# Patient Record
Sex: Female | Born: 1990 | Race: White | Hispanic: No | Marital: Single | Smoking: Never smoker
Health system: Southern US, Community
[De-identification: ages and names within clinical notes are randomized; demographics above are authoritative.]

## PROBLEM LIST (undated history)

## (undated) DIAGNOSIS — M549 Dorsalgia, unspecified: Secondary | ICD-10-CM

## (undated) DIAGNOSIS — L409 Psoriasis, unspecified: Secondary | ICD-10-CM

## (undated) DIAGNOSIS — L709 Acne, unspecified: Secondary | ICD-10-CM

## (undated) DIAGNOSIS — F419 Anxiety disorder, unspecified: Secondary | ICD-10-CM

## (undated) HISTORY — DX: Acne, unspecified: L70.9

## (undated) HISTORY — PX: NO PAST SURGERIES: SHX2092

## (undated) HISTORY — DX: Psoriasis, unspecified: L40.9

---

## 2016-06-14 DIAGNOSIS — L409 Psoriasis, unspecified: Secondary | ICD-10-CM | POA: Insufficient documentation

## 2016-06-14 DIAGNOSIS — F419 Anxiety disorder, unspecified: Secondary | ICD-10-CM | POA: Insufficient documentation

## 2016-06-14 DIAGNOSIS — Z227 Latent tuberculosis: Secondary | ICD-10-CM | POA: Insufficient documentation

## 2016-06-14 DIAGNOSIS — L709 Acne, unspecified: Secondary | ICD-10-CM | POA: Insufficient documentation

## 2016-06-18 ENCOUNTER — Ambulatory Visit
Admission: RE | Admit: 2016-06-18 | Discharge: 2016-06-18 | Disposition: A | Discharge status: Home / Self Care | Payer: BC Managed Care – PPO | Source: Ambulatory Visit | Attending: Internal Medicine | Admitting: Internal Medicine

## 2016-06-18 ENCOUNTER — Encounter: Payer: Self-pay | Admitting: Internal Medicine

## 2016-06-18 ENCOUNTER — Other Ambulatory Visit: Payer: Self-pay | Admitting: *Deleted

## 2016-06-18 ENCOUNTER — Ambulatory Visit (INDEPENDENT_AMBULATORY_CARE_PROVIDER_SITE_OTHER): Payer: BC Managed Care – PPO | Admitting: Internal Medicine

## 2016-06-18 DIAGNOSIS — L409 Psoriasis, unspecified: Secondary | ICD-10-CM

## 2016-06-18 DIAGNOSIS — F419 Anxiety disorder, unspecified: Secondary | ICD-10-CM

## 2016-06-18 DIAGNOSIS — R7611 Nonspecific reaction to tuberculin skin test without active tuberculosis: Secondary | ICD-10-CM

## 2016-06-18 DIAGNOSIS — Z227 Latent tuberculosis: Secondary | ICD-10-CM

## 2016-06-18 DIAGNOSIS — L709 Acne, unspecified: Secondary | ICD-10-CM

## 2016-06-18 MED ORDER — RIFAPENTINE 150 MG PO TABS: 900.0000 mg | ORAL_TABLET | ORAL | 2 refills | Status: DC

## 2016-06-18 MED ORDER — ISONIAZID 300 MG PO TABS: 900.0000 mg | ORAL_TABLET | ORAL | 2 refills | Status: DC

## 2016-06-18 NOTE — Assessment & Plan Note (Signed)
She has latent tuberculosis. I will get a baseline chest x-ray and start her on the new 12 week regimen consisting of weekly isoniazid in rifapentine. Her recent liver enzymes were normal. I reviewed potential side effects and drug interactions. Rifapentine can interact with alprazolam leading to reduced, affective blood levels of alprazolam. She only takes it as needed and fairly infrequently. I doubt that this will be a significant problem.

## 2016-06-18 NOTE — Progress Notes (Signed)
RN called patient to see if she was on her way to her 9am appointment with Dr Orvan Falconerampbell, left message on voicemail. Andree CossHowell, Harli Engelken M, RN

## 2016-06-18 NOTE — Progress Notes (Signed)
[facility]  Reason for Consult: Latent tuberculosis Referring Physician: Dr. Sigmund Hazel  Patient Active Problem List   Diagnosis Date Noted  . Latent tuberculosis by blood test 06/14/2016    Priority: High  . 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 Acne 06/14/2016  . Anxiety 06/14/2016    Patient's Medications  New Prescriptions   ISONIAZID (NYDRAZID) 300 MG TABLET    Take 3 tablets (900 mg total) by mouth once a week.   RIFAPENTINE 150 MG TABS    Take 6 tablets (900 mg total) by mouth once a week.  Previous Medications   ALPRAZOLAM (XANAX) 0.25 MG TABLET       FERROUS SULFATE (IRON SUPPLEMENT) 325 (65 FE) MG TABLET    Take 325 mg by mouth daily with breakfast.  Modified Medications   No medications on file  Discontinued Medications   No medications on file    Recommendations: 1. Chest x-ray today 2. Start isoniazid 900 mg weekly and rifapentine 900 mg weekly for 12 weeks 3. Follow-up in 6 weeks   Assessment: She has latent tuberculosis. I will get a baseline chest x-ray and start her on the new 12 week regimen consisting of weekly isoniazid in rifapentine. Her recent liver enzymes were normal. I reviewed potential side effects and drug interactions. Rifapentine can interact with alprazolam leading to reduced, affective blood levels of alprazolam. She only takes it as needed and fairly infrequently. I doubt that this will be a significant problem.   HPI: Cindy Vasquez is a 26 y.o. female who is referred to me because she recently tested positive on a QuantiFERON gold TB assay. She was being screened for latent tuberculosis because she has nail psoriasis and may start immunosuppressive therapies. 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 All previous test had been negative. 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 She has no history of pneumonia.  She has had no fever, chills, sweats, cough, shortness of breath, change in appetite or weight. She takes iron supplements and alprazolam as needed. She is not on oral contraceptives and is not currently pregnant.  Review of Systems: Review of Systems  Constitutional: Negative for chills, diaphoresis, fever, malaise/fatigue and weight loss.  HENT: Negative for sore throat.   Respiratory: Negative for cough, sputum production and shortness of breath.   Cardiovascular: Negative for chest pain.  Gastrointestinal: Negative for abdominal pain, diarrhea, heartburn, nausea and vomiting.  Genitourinary: Negative for dysuria and frequency.  Musculoskeletal: Negative for joint pain and myalgias.  Skin: Negative for rash.  Neurological: Negative for dizziness and headaches.  Psychiatric/Behavioral: Negative for depression and substance abuse. The patient is nervous/anxious.       No past medical history on file.  Social History  Substance Use Topics  . Smoking status: Not on file  . Smokeless tobacco: Not on file  . Alcohol use Not on file    No family history on file. Allergies not on file  OBJECTIVE: Vitals:   06/18/16 0923  BP: 137/84  Pulse: 91  Temp: 99.3 F (37.4 C)  TempSrc: Oral  Weight: 141 lb (64 kg)  Height: 5\' 7"  (1.702 m)   Body mass index is 22.08 kg/m.   Physical Exam  Constitutional: She is oriented to person, place, and time.  She is smiling and in good spirits.  HENT:  Mouth/Throat: No oropharyngeal exudate.  Cardiovascular: Normal rate and regular rhythm.   No murmur heard. Pulmonary/Chest: Effort normal and breath sounds normal. She has no wheezes. She has no rales.  Abdominal: Soft. There is no tenderness.  Neurological: She is alert and oriented to person, place, and time.  Skin: No rash noted.  Psychiatric: Mood and affect normal.    Microbiology: No results found for this or any previous visit (from the past 240 hour(s)).  Cliffton AstersJohn Ralene Gasparyan,  MD [facility] [phone] pager   [phone] cell 06/18/2016, 9:50 AM

## 2016-08-06 ENCOUNTER — Ambulatory Visit: Payer: BC Managed Care – PPO | Admitting: Internal Medicine

## 2016-09-10 ENCOUNTER — Encounter: Payer: Self-pay | Admitting: Internal Medicine

## 2016-09-10 ENCOUNTER — Ambulatory Visit (INDEPENDENT_AMBULATORY_CARE_PROVIDER_SITE_OTHER): Payer: BC Managed Care – PPO | Admitting: Internal Medicine

## 2016-09-10 DIAGNOSIS — R7611 Nonspecific reaction to tuberculin skin test without active tuberculosis: Secondary | ICD-10-CM

## 2016-09-10 DIAGNOSIS — Z227 Latent tuberculosis: Secondary | ICD-10-CM

## 2016-09-10 NOTE — Progress Notes (Signed)
[facility]  Patient Active Problem List   Diagnosis Date Noted  . Latent tuberculosis by blood test 06/14/2016    Priority: High  . 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 Acne 06/14/2016  . Anxiety 06/14/2016    Patient's Medications  New Prescriptions   No medications on file  Previous Medications   ALPRAZOLAM (XANAX) 0.25 MG TABLET       CEPHALEXIN (KEFLEX) 500 MG CAPSULE       FERROUS SULFATE (IRON SUPPLEMENT) 325 (65 FE) MG TABLET    Take 325 mg by mouth daily with breakfast.   ISONIAZID (NYDRAZID) 300 MG TABLET    Take 3 tablets (900 mg total) by mouth once a week.   RIFAPENTINE 150 MG TABS    Take 6 tablets (900 mg total) by mouth once a week.  Modified Medications   No medications on file  Discontinued Medications   No medications on file    Subjective: Ms. Dampier is in for her routine follow-up visit. She started on INH and rifapentine on 08/03/2016. She is now completed 6 of her weekly doses. She has a little bit of stomach upset on the day that she takes her medication but otherwise is tolerating it well. She also recently developed a vaginal discharge and was told that she had staph infection. She started taking cephalexin yesterday. She is worried about how these medications may affect her liver.  Review of Systems: Review of Systems  Constitutional: Negative for chills, diaphoresis, fever and weight loss.  Respiratory: Negative for cough, sputum production and shortness of breath.   Gastrointestinal: Negative for abdominal pain, diarrhea, heartburn, nausea and vomiting.       As noted in history of present illness.    Past Medical History:  Diagnosis Date  . Acne   . Psoriasis of nail     Social History  Substance Use Topics  . Smoking status: Never Smoker  . Smokeless tobacco: Never Used  . Alcohol use No     Comment: occassional    Family History  Problem Relation Age of Onset  . Rheum arthritis Mother   .  Hypertension Mother   . Asthma Sister   . Rheum arthritis Sister   . Anxiety disorder Sister   . Psoriasis Sister     No Known Allergies  Objective: Vitals:   09/10/16 0903  BP: 105/72  Pulse: 90  Temp: 98.8 F (37.1 C)  TempSrc: Oral  SpO2: 100%  Weight: 139 lb 12.8 oz (63.4 kg)   Body mass index is 21.9 kg/m.  Physical Exam  Constitutional: She is oriented to person, place, and time.  She is alert and in good spirits.  Cardiovascular: Normal rate and regular rhythm.   No murmur heard. Pulmonary/Chest: Effort normal and breath sounds normal. She has no wheezes. She has no rales.  Abdominal: Soft. There is no tenderness.  Neurological: She is oriented to person, place, and time.  Skin: No rash noted.  Psychiatric: Mood and affect normal.    Lab Results    Problem List Items Addressed This Visit      High   Latent tuberculosis by blood test    She is doing reasonably well on therapy for latent tuberculosis. I will have her check liver enzymes today and see her back after completion of therapy in 6 more weeks.          Cliffton Asters, MD [facility]  D[facility] [phone] pager   [phone] cell 09/10/2016, 9:18 AM

## 2016-09-10 NOTE — Assessment & Plan Note (Signed)
She is doing reasonably well on therapy for latent tuberculosis. I will have her check liver enzymes today and see her back after completion of therapy in 6 more weeks.

## 2016-09-11 LAB — COMPREHENSIVE METABOLIC PANEL
ALT: 11 U/L (ref 6–29)
AST: 17 U/L (ref 10–30)
Albumin: 4.8 g/dL (ref 3.6–5.1)
Alkaline Phosphatase: 74 U/L (ref 33–115)
BUN: 10 mg/dL (ref 7–25)
CHLORIDE: 105 mmol/L (ref 98–110)
CO2: 20 mmol/L (ref 20–32)
Calcium: 9.8 mg/dL (ref 8.6–10.2)
Creat: 0.74 mg/dL (ref 0.50–1.10)
GLUCOSE: 96 mg/dL (ref 65–99)
POTASSIUM: 4.4 mmol/L (ref 3.5–5.3)
Sodium: 138 mmol/L (ref 135–146)
Total Bilirubin: 1.1 mg/dL (ref 0.2–1.2)
Total Protein: 7.7 g/dL (ref 6.1–8.1)

## 2016-11-05 ENCOUNTER — Ambulatory Visit (INDEPENDENT_AMBULATORY_CARE_PROVIDER_SITE_OTHER): Payer: BC Managed Care – PPO | Admitting: Internal Medicine

## 2016-11-05 ENCOUNTER — Encounter: Payer: Self-pay | Admitting: Internal Medicine

## 2016-11-05 DIAGNOSIS — R7611 Nonspecific reaction to tuberculin skin test without active tuberculosis: Secondary | ICD-10-CM

## 2016-11-05 DIAGNOSIS — Z227 Latent tuberculosis: Secondary | ICD-10-CM

## 2016-11-05 NOTE — Assessment & Plan Note (Signed)
She has completed therapy for latent tuberculosis. I told her that this should decrease her lifetime risk of progressing to active tuberculosis by greater than 95%. I told her that she should still inform her physicians that she has been treated for latent tuberculosis in case she ever develops clinical evidence of possible active tuberculosis. She does not need repeat QuantiFERON gold or skin testing as these should always remain positive. If she needs therapy for her nail psoriasis she could start on that at any time from my standpoint. She can follow-up here as needed

## 2016-11-05 NOTE — Addendum Note (Signed)
Addended by: Andree Coss on: 11/05/2016 02:52 PM   Modules accepted: Orders

## 2016-11-05 NOTE — Progress Notes (Signed)
[facility]  Patient Active Problem List   Diagnosis Date Noted  . Latent tuberculosis by blood test 06/14/2016    Priority: High  . 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 Acne 06/14/2016  . Anxiety 06/14/2016    Patient's Medications  New Prescriptions   No medications on file  Previous Medications   ALPRAZOLAM (XANAX) 0.25 MG TABLET       CEPHALEXIN (KEFLEX) 500 MG CAPSULE       FERROUS SULFATE (IRON SUPPLEMENT) 325 (65 FE) MG TABLET    Take 325 mg by mouth daily with breakfast.   ISONIAZID (NYDRAZID) 300 MG TABLET    Take 3 tablets (900 mg total) by mouth once a week.   RIFAPENTINE 150 MG TABS    Take 6 tablets (900 mg total) by mouth once a week.  Modified Medications   No medications on file  Discontinued Medications   No medications on file    Subjective: Cindy Vasquez is here for her routine follow-up visit. She completed her 12 week course of isoniazid and rifampin 10 for latent tuberculosis in late September. She had some problems with vaginal discharge while on treatment but otherwise tolerated it well.  Review of Systems: Review of Systems  Constitutional: Negative for chills, diaphoresis, fever and weight loss.  Respiratory: Negative for cough, sputum production and shortness of breath.   Cardiovascular: Negative for chest pain.  Gastrointestinal: Negative for abdominal pain, diarrhea, nausea and vomiting.  Skin: Negative for rash.    Past Medical History:  Diagnosis Date  . Acne   . Psoriasis of nail     Social History  Substance Use Topics  . Smoking status: Never Smoker  . Smokeless tobacco: Never Used  . Alcohol use No     Comment: occassional    Family History  Problem Relation Age of Onset  . Rheum arthritis Mother   . Hypertension Mother   . Asthma Sister   . Rheum arthritis Sister   . Anxiety disorder Sister   . Psoriasis Sister     No Known Allergies  Objective: Vitals:   11/05/16 1422  BP: 124/72    Pulse: (!) 103  Temp: (!) 103 F (39.4 C)  TempSrc: Oral  Weight: 137 lb (62.1 kg)  Height:  (1.702 m)   Body mass index is 21.46 kg/m.  Physical Exam  Constitutional: She is oriented to person, place, and time.  She is smiling and in good spirits.  HENT:  Mouth/Throat: No oropharyngeal exudate.  Cardiovascular: Normal rate and regular rhythm.   No murmur heard. Pulmonary/Chest: Effort normal and breath sounds normal.  Neurological: She is alert and oriented to person, place, and time.  Skin: No rash noted.  Psychiatric: Mood and affect normal.    Lab Results    Problem List Items Addressed This Visit      High   Latent tuberculosis by blood test    She has completed therapy for latent tuberculosis. I told her that this should decrease her lifetime risk of progressing to active tuberculosis by greater than 95%. I told her that she should still inform her physicians that she has been treated for latent tuberculosis in case she ever develops clinical evidence of possible active tuberculosis. She does not need repeat QuantiFERON gold or skin testing as these should always remain positive. If she needs therapy for her nail psoriasis she could start on that at any time from my standpoint.  She can follow-up here as needed          Cliffton Asters, MD [facility] for Infectious Disease [facility] [phone] pager   [phone] cell 11/05/2016, 2:38 PM

## 2019-04-07 ENCOUNTER — Encounter: Payer: Self-pay | Admitting: Emergency Medicine

## 2019-04-07 ENCOUNTER — Emergency Department (INDEPENDENT_AMBULATORY_CARE_PROVIDER_SITE_OTHER): Payer: BC Managed Care – PPO

## 2019-04-07 ENCOUNTER — Emergency Department
Admission: EM | Admit: 2019-04-07 | Discharge: 2019-04-07 | Disposition: A | Discharge status: Home / Self Care | Payer: BC Managed Care – PPO | Source: Home / Self Care | Attending: Family Medicine | Admitting: Family Medicine

## 2019-04-07 ENCOUNTER — Other Ambulatory Visit: Payer: Self-pay

## 2019-04-07 DIAGNOSIS — M48061 Spinal stenosis, lumbar region without neurogenic claudication: Secondary | ICD-10-CM

## 2019-04-07 DIAGNOSIS — G8929 Other chronic pain: Secondary | ICD-10-CM | POA: Diagnosis not present

## 2019-04-07 DIAGNOSIS — M533 Sacrococcygeal disorders, not elsewhere classified: Secondary | ICD-10-CM

## 2019-04-07 DIAGNOSIS — M2170 Unequal limb length (acquired), unspecified site: Secondary | ICD-10-CM

## 2019-04-07 HISTORY — DX: Dorsalgia, unspecified: M54.9

## 2019-04-07 HISTORY — DX: Anxiety disorder, unspecified: F41.9

## 2019-04-07 MED ORDER — PREDNISONE 20 MG PO TABS: ORAL_TABLET | ORAL | 0 refills | Status: DC

## 2019-04-07 MED ORDER — CYCLOBENZAPRINE HCL 10 MG PO TABS: 10.0000 mg | ORAL_TABLET | Freq: Every day | ORAL | 1 refills | Status: DC

## 2019-04-07 NOTE — ED Provider Notes (Signed)
[facility]    CSN: [id number] Arrival date & time: 04/07/19  1652      History   Chief Complaint Chief Complaint  Patient presents with  . Back Pain    HPI ISMA TIETJE Cindy a 29 y.o. Vasquez.   While doing a short sprint four days ago patient experienced sudden sharp right lower back pain that has persisted, and occasionally radiates to her right buttock and lateral leg.  Rarely she feels pain radiating to her right foot.  She denies bowel or bladder dysfunction, and no saddle numbness.  Initially she was unable to walk without assistance.  She can now walk slowly, but still has pain with any movement. She has a history of recurring right lower back pain, having visited a chiropractor and back specialist about a month ago.  Her present pain Cindy the worst that she has had.  The history Cindy provided by the patient.  Back Pain Location:  Lumbar spine, sacro-iliac joint and gluteal region Quality:  Shooting and stabbing Radiates to:  R posterior upper leg Pain severity:  Severe Pain Cindy:  Same all the time Onset quality:  Sudden Duration:  4 days Timing:  Constant Progression:  Improving Chronicity:  Recurrent Context: recent injury   Relieved by:  Nothing Worsened by:  Ambulation, bending, movement and standing Ineffective treatments:  Cold packs and ibuprofen Associated symptoms: no abdominal pain, no abdominal swelling, no bladder incontinence, no bowel incontinence, no chest pain, no dysuria, no fever, no leg pain, no numbness, no paresthesias, no pelvic pain, no perianal numbness, no tingling, no weakness and no weight loss     Past Medical History:  Diagnosis Date  . Acne   . Anxiety   . Back pain   . Psoriasis of nail     Patient Active Problem List   Diagnosis Date Noted  . Latent tuberculosis by blood test 06/14/2016  . 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 Acne 06/14/2016  . Anxiety 06/14/2016    History reviewed. No pertinent surgical history.  OB  History   No obstetric history on file.      Home Medications    Prior to Admission medications   Medication Sig Start Date End Date Taking? Authorizing Provider  ALPRAZolam Prudy Feeler) 0.25 MG tablet  05/14/16   [provider]  cyclobenzaprine (FLEXERIL) 10 MG tablet Take 1 tablet (10 mg total) by mouth at bedtime. 04/07/19   Lattie Haw, MD  predniSONE (DELTASONE) 20 MG tablet Take one tab by mouth twice daily for 4 days, then one daily. Take with food. 04/07/19   Lattie Haw, MD    Family History Family History  Problem Relation Age of Onset  . Rheum arthritis Mother   . Hypertension Mother   . Asthma Sister   . Rheum arthritis Sister   . Anxiety disorder Sister   . Psoriasis Sister     Social History Social History   Tobacco Use  . Smoking status: Never Smoker  . Smokeless tobacco: Never Used  Substance Use Topics  . Alcohol use: No    Comment: occassional  . Drug use: No     Allergies   Patient has no known allergies.   Review of Systems Review of Systems  Constitutional: Positive for activity change. Negative for appetite change, chills, diaphoresis, fatigue, fever, unexpected weight change and weight loss.  Cardiovascular: Negative for chest pain.  Gastrointestinal: Negative for abdominal pain and bowel incontinence.  Genitourinary: Negative for bladder  incontinence, dysuria, frequency and pelvic pain.  Musculoskeletal: Positive for back pain. Negative for neck pain.  Skin: Negative for rash.  Neurological: Negative for tingling, weakness, numbness and paresthesias.     Physical Exam Triage Vital Signs ED Triage Vitals  Enc Vitals Group     BP 04/07/19 1710 106/69     Pulse Rate 04/07/19 1710 (!) 8     Resp 04/07/19 1710 18     Temp 04/07/19 1710 99 F (37.2 C)     Temp Source 04/07/19 1710 Oral     SpO2 04/07/19 1710 99 %     Weight 04/07/19 1712 128 lb (58.1 kg)     Height 04/07/19 1712 5\' 7"  (1.702 m)     Head Circumference --       Peak Flow --      Pain Score 04/07/19 1711 6     Pain Loc --      Pain Edu? --      Excl. in GC? --    No data found.  Updated Vital Signs BP 106/69 (BP Location: Left Arm)   Pulse (!) 8   Temp 99 F (37.2 C) (Oral)   Resp 18   Ht 5\' 7"  (1.702 m)   Wt 58.1 kg   LMP 03/21/2019   SpO2 99%   BMI 20.05 kg/m   Visual Acuity Right Eye Distance:   Left Eye Distance:   Bilateral Distance:    Right Eye Near:   Left Eye Near:    Bilateral Near:     Physical Exam Vitals and nursing note reviewed.  Constitutional:      General: She Cindy not in acute distress.    Comments: Patient has pain with any movement.  She walks slowly and deliberately.  HENT:     Head: Normocephalic.     Right Ear: External ear normal.     Left Ear: External ear normal.     Nose: Nose normal.     Mouth/Throat:     Pharynx: Oropharynx Cindy clear.  Eyes:     Pupils: Pupils are equal, round, and reactive to light.  Cardiovascular:     Rate and Rhythm: Normal rate.     Heart sounds: Normal heart sounds.  Pulmonary:     Breath sounds: Normal breath sounds.  Abdominal:     Palpations: Abdomen Cindy soft.     Tenderness: There Cindy no abdominal tenderness.  Musculoskeletal:     Cervical back: Normal range of motion.     Lumbar back: Tenderness present. No swelling or bony tenderness. Decreased range of motion. Positive right straight leg raise test and positive left straight leg raise test.       Back:     Right lower leg: No edema.     Left lower leg: Edema present.     Comments: Back:  Range of motion decreased; patient has difficulty flexing and extending her back.  She can toe walk and squat without significant difficulty. Patient has minimal tenderness to palpation over her lower back, but experiences pain in her right sacral area.  Sitting knee extension test Cindy positive bilaterally.  Strength and sensation in the lower extremities Cindy normal.  Patellar and achilles reflexes are normal. Patient's  right leg appears to be approximately 2+cm longer than the left by inspection.   Skin:    General: Skin Cindy warm and dry.  Neurological:     Mental Status: She Cindy alert.      UC Treatments /  Results  Labs (all labs ordered are listed, but only abnormal results are displayed) Labs Reviewed - No data to display  EKG   Radiology DG Lumbar Spine Complete  Result Date: 04/07/2019 CLINICAL DATA:  Acute onset of right low back pain for days ago after sprinting. No constant pain. EXAM: LUMBAR SPINE - COMPLETE 4+ VIEW COMPARISON:  None. FINDINGS: The alignment Cindy maintained. Vertebral body heights are normal. There Cindy no listhesis. The posterior elements are intact. Minor disc space narrowing at L4-L5 and L5-S1. No fracture. Sacroiliac joints are symmetric and normal. IMPRESSION: 1. No acute fracture or subluxation of the lumbar spine. 2. Minor disc space narrowing at L4-L5 and L5-S1. Electronically Signed   By: Keith Rake M.D.   On: 04/07/2019 18:13    Procedures Procedures (including critical care time)  Medications Ordered in UC Medications - No data to display  Initial Impression / Assessment and Plan / UC Course  I have reviewed the triage vital signs and the nursing notes.  Pertinent labs & imaging results that were available during my care of the patient were reviewed by me and considered in my medical decision making (see chart for details).    Note unremarkable LS spine (no scoliosis).  Suspect patient's symptoms are resulting from leg length discrepancy. Begin prednisone burst/taper.  Rx for Flexeril at bedtime. Followup with Dr. Aundria Mems ([facility]) for further evaluation and management.   Final Clinical Impressions(s) / UC Diagnoses   Final diagnoses:  Chronic right SI joint pain  Leg length discrepancy     Discharge Instructions     Apply ice pack to right lower back for 20 to 30 minutes, 3 to 4 times daily  Continue until pain  decreases.  May take Tylenol daytime for pain.    ED Prescriptions    Medication Sig Dispense Auth. Provider   predniSONE (DELTASONE) 20 MG tablet Take one tab by mouth twice daily for 4 days, then one daily. Take with food. 12 tablet Kandra Nicolas, MD   cyclobenzaprine (FLEXERIL) 10 MG tablet Take 1 tablet (10 mg total) by mouth at bedtime. 10 tablet Kandra Nicolas, MD        Kandra Nicolas, MD 04/07/19 [phone]

## 2019-04-07 NOTE — ED Triage Notes (Signed)
Was doing short sprint 4 days ago; felt sudden shooting back pain that made all mobility difficulty. History of back issues; has used chiropractics and spine specialist in past, but not since this incident. Has used ibuprofen 800 mg at 1000 without relief.

## 2019-04-07 NOTE — Discharge Instructions (Addendum)
Apply ice pack to right lower back for 20 to 30 minutes, 3 to 4 times daily  Continue until pain decreases.  May take Tylenol daytime for pain.

## 2019-04-20 ENCOUNTER — Ambulatory Visit (INDEPENDENT_AMBULATORY_CARE_PROVIDER_SITE_OTHER): Payer: BC Managed Care – PPO | Admitting: Sports Medicine

## 2019-04-20 ENCOUNTER — Encounter: Payer: Self-pay | Admitting: Sports Medicine

## 2019-04-20 ENCOUNTER — Other Ambulatory Visit: Payer: Self-pay

## 2019-04-20 DIAGNOSIS — M2170 Unequal limb length (acquired), unspecified site: Secondary | ICD-10-CM

## 2019-04-20 DIAGNOSIS — M51369 Other intervertebral disc degeneration, lumbar region without mention of lumbar back pain or lower extremity pain: Secondary | ICD-10-CM | POA: Insufficient documentation

## 2019-04-20 DIAGNOSIS — M5136 Other intervertebral disc degeneration, lumbar region: Secondary | ICD-10-CM

## 2019-04-20 MED ORDER — CYCLOBENZAPRINE HCL 10 MG PO TABS: ORAL_TABLET | ORAL | 0 refills | Status: AC

## 2019-04-20 NOTE — Assessment & Plan Note (Signed)
She also has a mild leg length discrepancy, right longer than left. The difference is less than 0.5 cm so this does not need to be treated.

## 2019-04-20 NOTE — Progress Notes (Addendum)
    Procedures performed today:    None.  Independent interpretation of notes and tests performed by another provider:   None.  Brief History, Exam, Impression, and Recommendations:    Lumbar degenerative disc disease This is a pleasant 29 year old female, she has axial back pain, she is had this on and off for several years. More recently she has had 2 flares in close succession. Pain is axial, discogenic, she did have a positive straight leg raise at the time. Ultimately she has been seen by spine specialist who told her she was fine. She was recently seen in urgent care, treated aggressively with prednisone and referred to me for further evaluation. Symptoms have improved considerably, no bowel or bladder dysfunction, saddle numbness. We are to treat her with physical therapy, Flexeril at bedtime, she can use NSAIDs as desired, because of her duration of pain on and off for years in spite of conservative measures we are going to also proceed with an MRI of the lumbar spine. I discussed the evolutionary anthropology of low back pain, the anatomy, she can return to see me after 4 to 6 weeks of physical therapy.  Leg length discrepancy She also has a mild leg length discrepancy, right longer than left. The difference is less than 0.5 cm so this does not need to be treated.    ___________________________________________ Ihor Austin. Benjamin Stain, M.D., ABFM., CAQSM. Primary Care and Sports Medicine [facility]  Adjunct Instructor of Family Medicine  [employer]

## 2019-04-20 NOTE — Assessment & Plan Note (Signed)
This is a pleasant 29 year old female, she has axial back pain, she is had this on and off for several years. More recently she has had 2 flares in close succession. Pain is axial, discogenic, she did have a positive straight leg raise at the time. Ultimately she has been seen by spine specialist who told her she was fine. She was recently seen in urgent care, treated aggressively with prednisone and referred to me for further evaluation. Symptoms have improved considerably, no bowel or bladder dysfunction, saddle numbness. We are to treat her with physical therapy, Flexeril at bedtime, she can use NSAIDs as desired, because of her duration of pain on and off for years in spite of conservative measures we are going to also proceed with an MRI of the lumbar spine. I discussed the evolutionary anthropology of low back pain, the anatomy, she can return to see me after 4 to 6 weeks of physical therapy.

## 2019-05-04 ENCOUNTER — Ambulatory Visit: Payer: BC Managed Care – PPO | Admitting: Rehabilitative and Restorative Service Providers"

## 2019-05-04 ENCOUNTER — Encounter: Payer: Self-pay | Admitting: Rehabilitative and Restorative Service Providers"

## 2019-05-04 ENCOUNTER — Other Ambulatory Visit: Payer: Self-pay

## 2019-05-04 DIAGNOSIS — M533 Sacrococcygeal disorders, not elsewhere classified: Secondary | ICD-10-CM

## 2019-05-04 DIAGNOSIS — M545 Low back pain, unspecified: Secondary | ICD-10-CM

## 2019-05-04 DIAGNOSIS — R29898 Other symptoms and signs involving the musculoskeletal system: Secondary | ICD-10-CM

## 2019-05-04 NOTE — Patient Instructions (Signed)
Access Code: [email or web address]: [email or web address]: 04/13/2021Prepared by: Analeia Ismael HoltExercises  Prone Press Up - 2 x daily - 7 x weekly - 1 sets - 10 reps - 3 sec hold  Prone Quadriceps Stretch with Strap - 2 x daily - 7 x weekly - 3 reps - 1 sets - 30 sec hold  Hooklying Hamstring Stretch with Strap - 2 x daily - 7 x weekly - 3 reps - 1 sets - 30 sec hold  Supine Piriformis Stretch with Leg Straight - 2 x daily - 7 x weekly - 3 reps - 1 sets - 30 sec hold Patient Education  TENS Therapy

## 2019-05-04 NOTE — Therapy (Signed)
[facility] [place] [address] [place], [place], [address] Phone: [phone]   Fax:  [phone]  Physical Therapy Evaluation  Patient Details  Name: Cindy Vasquez MRN: [id number] Date of Birth: 1991-01-01 Referring Provider (PT): Dr Benjamin Stain    Encounter Date: 05/04/2019  PT End of Session - 05/04/19 0926    Visit Number  1    Number of Visits  12    Date for PT Re-Evaluation  06/15/19    PT Start Time  0848    PT Stop Time  0937    PT Time Calculation (min)  49 min    Activity Tolerance  Patient tolerated treatment well       Past Medical History:  Diagnosis Date  . Acne   . Anxiety   . Back pain   . Psoriasis of nail     Past Surgical History:  Procedure Laterality Date  . NO PAST SURGERIES      There were no vitals filed for this visit.   Subjective Assessment - 05/04/19 0852    Subjective  Patient reports ~ 2 year history of LBP after stepping wrong when doing a lunge. She was treated for a muscle spasm with resolution. Patient has had some recurrent episodes of LBP most recently ~ 5 weeks ago with severe pain. She was treated with medication with some improvement. She continues to have discomfort in the LB. Feels "off"    Pertinent History  recurrent LBP    Patient Stated Goals  get rid of back pain and prevent recurrent pain    Currently in Pain?  Yes    Pain Score  3     Pain Location  Back    Pain Orientation  Left;Right;Lower    Pain Descriptors / Indicators  Aching;Tightness    Pain Type  Acute pain;Chronic pain    Pain Onset  More than a month ago    Pain Frequency  Intermittent    Aggravating Factors   turning over in bed; walking; twisting    Pain Relieving Factors  meds; avoiding activities that irritate back         [facility] PT Assessment - 05/04/19 0001      Assessment   Medical Diagnosis  LBP    Referring Provider (PT)  Dr Benjamin Stain     Onset Date/Surgical Date  04/04/19    Hand  Dominance  Right    Next MD Visit  05/18/19    Prior Therapy  none      Precautions   Precautions  None      Balance Screen   Has the patient fallen in the past 6 months  No    Has the patient had a decrease in activity level because of a fear of falling?   No    Is the patient reluctant to leave their home because of a fear of falling?   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     Leisure  working 4 days/wk lifting [employer]; some running - kickball       Observation/Other Assessments   Focus on Therapeutic Outcomes (FOTO)   40% limitation       Sensation   Additional Comments  WFL per pt report       Posture/Postural Control   Posture  Comments  ASIS elevated on the Rt       AROM   Lumbar Flexion  70% pulling     Lumbar Extension  75%     Lumbar - Right Side Bend  75% mild discomfort LB    Lumbar - Left Side Bend  75% mild discomfort LB    Lumbar - Right Rotation  70% pulling with return to midline    Lumbar - Left Rotation  70% pulling with return to midline      Strength   Overall Strength Comments  WFL's - not tested resistively       Flexibility   Hamstrings  tight Lt > Rt     Quadriceps  tight bilat     ITB  tight bilat     Piriformis  tight Rt > Lt       Palpation   Spinal mobility  hypomobile lumbar spine discomfort L4/5     SI assessment   elevated Rt ASIA; tilted Rt SI/pelvis in prone - PSIS ~ level     Palpation comment  muscular tightness Rt > Lt psoas; Rt lumbar paraspinals/QL       Special Tests    Special Tests  --   supine to sit Rt LE longer than Lt assessed-ankle malleoli   Other special tests  (+) slump test; (-) SLR (tight HS)        Transfers   Comments  moves slowly to change positions due to pain       Ambulation/Gait   Gait Comments  antalgic gait - slow                Objective measurements  completed on examination: See above findings.      [facility] PT Treatment/Exercise - 05/04/19 0001      Lumbar Exercises: Stretches   Passive Hamstring Stretch  Right;Left;2 reps;30 seconds    Press Ups  5 reps   3 sec    Quad Stretch  Right;Left;2 reps;30 seconds   prone with strap    Piriformis Stretch  Right;Left;2 reps;30 seconds   supine travell      Moist Heat Therapy   Number Minutes Moist Heat  15 Minutes    Moist Heat Location  Lumbar Spine      Electrical Stimulation   Electrical Stimulation Location  bilat lumbar     Electrical Stimulation Action  TENS     Electrical Stimulation Parameters  to tolerance     Electrical Stimulation Goals  Pain;Tone             PT Education - 05/04/19 1610    Education Details  HEP TENs    Person(s) Educated  Patient    Methods  Explanation;Demonstration;Tactile cues;Verbal cues;Handout    Comprehension  Verbalized understanding;Returned demonstration;Verbal cues required;Tactile cues required          PT Long Term Goals - 05/04/19 0950      PT LONG TERM GOAL #1   Title  Improve pelvic and lumbar alignment in standing and supine    Time  6    Period  Weeks    Status  New    Target Date  06/15/19      PT LONG TERM GOAL #2   Title  Decrease pain 50-75% allowing patient to return to normal functional and recreational activities    Time  6    Period  Weeks    Status  New    Target Date  06/15/19  PT LONG TERM GOAL #3   Title  Improve tissue extensibility with patient to demonstrate HS flexibility to 80 deg bilat; quad flexibility to heel ~ 6 inches from buttocks    Time  6    Period  Weeks    Status  New    Target Date  06/15/19      PT LONG TERM GOAL #4   Title  Independent in HEP    Time  6    Period  Weeks    Status  New    Target Date  06/15/19      PT LONG TERM GOAL #5   Title  Improve FOTO to </= 25% limitation    Time  6    Period  Weeks    Status  New    Target Date  06/15/19              Plan - 05/04/19 9357    Clinical Impression Statement  Patient presents with recurrent LBP with episodic flare ups over the past two years. Patient has elevated ASIS/pelvis on the Rt; decreased trunk and LE ROM/flexibility; decreased lumbar spinal mobility; muscular tightness through LE's, hips and lumbar spine; limited mobility and activity level. Patient will benefit from PT to address problems identified.    Stability/Clinical Decision Making  Stable/Uncomplicated    Clinical Decision Making  Low    Rehab Potential  Good    PT Frequency  2x / week    PT Duration  6 weeks    PT Treatment/Interventions  Patient/family education;ADLs/Self Care Home Management;Cryotherapy;Electrical Stimulation;Iontophoresis 4mg /ml Dexamethasone;Moist Heat;Traction;Ultrasound;Therapeutic activities;Therapeutic exercise;Neuromuscular re-education;Manual techniques;Dry needling;Taping    PT Next Visit Plan  further assess SI/pelvic dysfunction; review and progress exercise program; manual work/DN/taping as indicated; modalities as indicated(pt to order TENS unit for home)    PT Home Exercise Plan  [facility]    Consulted and Agree with Plan of Care  Patient       Patient will benefit from skilled therapeutic intervention in order to improve the following deficits and impairments:     Visit Diagnosis: Acute bilateral low back pain without sciatica - Plan: PT plan of care cert/re-cert  SI (sacroiliac) joint dysfunction - Plan: PT plan of care cert/re-cert  Other symptoms and signs involving the musculoskeletal system - Plan: PT plan of care cert/re-cert     Problem List Patient Active Problem List   Diagnosis Date Noted  . Lumbar degenerative disc disease 04/20/2019  . Leg length discrepancy 04/20/2019  . Latent tuberculosis by blood test 06/14/2016  . 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 Acne 06/14/2016  . Anxiety 06/14/2016    Mikeila Burgen 06/16/2016 PT, MPH  05/04/2019, 10:01 AM  [facility] [address] [place], [place], [place] Phone: [phone]   Fax:  [phone]  Name: COILA WARDELL MRN: Nelva Nay Date of Birth: 26-Oct-1990

## 2019-05-07 ENCOUNTER — Encounter: Payer: BC Managed Care – PPO | Admitting: Physical Therapy

## 2019-05-10 ENCOUNTER — Other Ambulatory Visit: Payer: Self-pay

## 2019-05-10 ENCOUNTER — Encounter: Payer: Self-pay | Admitting: Physical Therapy

## 2019-05-10 ENCOUNTER — Ambulatory Visit: Payer: BC Managed Care – PPO | Admitting: Physical Therapy

## 2019-05-10 DIAGNOSIS — M545 Low back pain, unspecified: Secondary | ICD-10-CM

## 2019-05-10 DIAGNOSIS — R29898 Other symptoms and signs involving the musculoskeletal system: Secondary | ICD-10-CM | POA: Diagnosis not present

## 2019-05-10 DIAGNOSIS — M533 Sacrococcygeal disorders, not elsewhere classified: Secondary | ICD-10-CM

## 2019-05-10 NOTE — Therapy (Signed)
[place] [place], [place], [address] Phone: [phone]   Fax:  [phone]  Physical Therapy Treatment  Patient Details  Name: Cindy Vasquez MRN: [id number] Date of Birth: [birth year] Referring Provider (PT): Dr Dianah Field    Encounter Date: 05/10/2019  PT End of Session - 05/10/19 1012    Visit Number  2    Number of Visits  12    Date for PT Re-Evaluation  06/15/19    PT Start Time  8250    PT Stop Time  1100    PT Time Calculation (min)  45 min    Activity Tolerance  Patient tolerated treatment well       Past Medical History:  Diagnosis Date  . Acne   . Anxiety   . Back pain   . Psoriasis of nail     Past Surgical History:  Procedure Laterality Date  . NO PAST SURGERIES      There were no vitals filed for this visit.  Subjective Assessment - 05/10/19 1015    Subjective  She has been doing the stretches; she thinks they are helping.  She has had one day of no back pain. she is now having some discomfort in her Lt hip now since starting HEP.   She had some soreness after running bases for kickball.    Pertinent History  recurrent LBP    Patient Stated Goals  get rid of back pain and prevent recurrent pain    Currently in Pain?  Yes    Pain Score  3     Pain Location  Back    Pain Orientation  Right;Left;Lower    Pain Descriptors / Indicators  Discomfort    Pain Onset  More than a month ago    Aggravating Factors   running, walking    Pain Relieving Factors  avoiding activities that irritate back         [facility] PT Assessment - 05/10/19 0001      Assessment   Medical Diagnosis  LBP    Referring Provider (PT)  Dr Dianah Field     Onset Date/Surgical Date  04/04/19    Hand Dominance  Right    Next MD Visit  05/18/19    Prior Therapy  none      Palpation   SI assessment   Lt ASIS higher than Rt, Lt PSIS lower than Rt, Lt leg appears shorter in supine.  sacrum appears level in  prone.     Palpation comment  muscular tightness noted on Lt lateral flank, Lt ITB and lateral hip; point tender in Lt iliopsoas.        [facility] Adult PT Treatment/Exercise - 05/10/19 0001      Transfers   Comments  moves slowly to change positions due to discomfort      Self-Care   Self-Care  Other Self-Care Comments    Other Self-Care Comments   Pt educated on self massage with ball to ant Lt hip (in prone) and lumbar hip musculature (in standing); pt returned demo with cues.       Lumbar Exercises: Stretches   Passive Hamstring Stretch  Right;Left;3 reps;30 seconds -supine with strap, with/without opp knee flexed.    Hip Flexor Stretch  Left;2 reps;20 seconds   seated trial; minimal stretch felt.    Press Ups  5 reps   3 sec    Quad Stretch  Right;Left;2 reps;20 seconds   prone with strap  Piriformis Stretch  Right;Left;1 rep;30 seconds   modified pigeon pose - and quick trial of fig 4 in supine, pulling knees up.   Other Lumbar Stretch Exercise  childs pose with lateral trunk flexion x 15 sec each direction.       Lumbar Exercises: Supine   Bridge Limitations  pt shown demo of this exercise for HEP (10 reps, 3 sec holds)      Manual Therapy   Manual Therapy  Soft tissue mobilization    Soft tissue mobilization  TPR to Lt QL; TPR to Lt iliacus      Pt declined modalities.             PT Long Term Goals - 05/04/19 0950      PT LONG TERM GOAL #1   Title  Improve pelvic and lumbar alignment in standing and supine    Time  6    Period  Weeks    Status  New    Target Date  06/15/19      PT LONG TERM GOAL #2   Title  Decrease pain 50-75% allowing patient to return to normal functional and recreational activities    Time  6    Period  Weeks    Status  New    Target Date  06/15/19      PT LONG TERM GOAL #3   Title  Improve tissue extensibility with patient to demonstrate HS flexibility to 80 deg bilat; quad flexibility to heel ~ 6 inches from buttocks    Time   6    Period  Weeks    Status  New    Target Date  06/15/19      PT LONG TERM GOAL #4   Title  Independent in HEP    Time  6    Period  Weeks    Status  New    Target Date  06/15/19      PT LONG TERM GOAL #5   Title  Improve FOTO to </= 25% limitation    Time  6    Period  Weeks    Status  New    Target Date  06/15/19            Plan - 05/10/19 1040    Clinical Impression Statement  Pt reported pain in Rt SI/low back when lifting LLE for hamstring stretch.  Pt continues with pelvic asymmetry; Lt inominate appears rotated posteriorly.  Palpable tightness (and tenderness) noted in Lt iliopsoas, QL, and piriformis.  Limted tolerance for Lt piriformis stretch.  Pt reported feeling "looser" in her LB at end of session.  Goals are ongoing.    Stability/Clinical Decision Making  Stable/Uncomplicated    Rehab Potential  Good    PT Frequency  2x / week    PT Duration  6 weeks    PT Treatment/Interventions  Patient/family education;ADLs/Self Care Home Management;Cryotherapy;Electrical Stimulation;Iontophoresis 4mg /ml Dexamethasone;Moist Heat;Traction;Ultrasound;Therapeutic activities;Therapeutic exercise;Neuromuscular re-education;Manual techniques;Dry needling;Taping    PT Next Visit Plan  review and progress exercise program; manual work/DN/taping as indicated; modalities as indicated    PT Home Exercise Plan  [facility]    Consulted and Agree with Plan of Care  Patient       Patient will benefit from skilled therapeutic intervention in order to improve the following deficits and impairments:     Visit Diagnosis: Acute bilateral low back pain without sciatica  SI (sacroiliac) joint dysfunction  Other symptoms and signs involving the musculoskeletal system  Problem List Patient Active Problem List   Diagnosis Date Noted  . Lumbar degenerative disc disease 04/20/2019  . Leg length discrepancy 04/20/2019  . Latent tuberculosis by blood test 06/14/2016  . Psoriasis of  nail 06/14/2016  . Acne 06/14/2016  . Anxiety 06/14/2016   Mayer Camel, PTA 05/10/19 12:58 PM  [facility] [place] [address] [place], [place], [address] Phone: [phone]   Fax:  [phone]  Name: KARINDA CABRIALES MRN: [id number] Date of Birth: Mar 19, 1990

## 2019-05-14 ENCOUNTER — Other Ambulatory Visit: Payer: Self-pay

## 2019-05-14 ENCOUNTER — Encounter: Payer: Self-pay | Admitting: Physical Therapy

## 2019-05-14 ENCOUNTER — Ambulatory Visit: Payer: BC Managed Care – PPO | Admitting: Physical Therapy

## 2019-05-14 DIAGNOSIS — M545 Low back pain, unspecified: Secondary | ICD-10-CM

## 2019-05-14 DIAGNOSIS — M533 Sacrococcygeal disorders, not elsewhere classified: Secondary | ICD-10-CM

## 2019-05-14 DIAGNOSIS — R29898 Other symptoms and signs involving the musculoskeletal system: Secondary | ICD-10-CM

## 2019-05-14 NOTE — Therapy (Signed)
[place], [place], [address] Phone: [phone]   Fax:  [phone]  Physical Therapy Treatment  Patient Details  Name: Cindy Vasquez MRN: [id number] Date of Birth: [birth year] Referring Provider (PT): Dr Dianah Field    Encounter Date: 05/14/2019  PT End of Session - 05/14/19 0901    Visit Number  3    Number of Visits  12    Date for PT Re-Evaluation  06/15/19    PT Start Time  0850    PT Stop Time  0934    PT Time Calculation (min)  44 min    Activity Tolerance  Patient tolerated treatment well       Past Medical History:  Diagnosis Date  . Acne   . Anxiety   . Back pain   . Psoriasis of nail     Past Surgical History:  Procedure Laterality Date  . NO PAST SURGERIES      There were no vitals filed for this visit.  Subjective Assessment - 05/14/19 1001    Subjective  Pt reports she is still having trouble getting comfortable when sleeping.  Awakes stiff; stretches have helped.  She played kickball and was able to run bases, however Lt ant hip was sore afterwards.  Took a hot bath and stretched and symptoms reduced.    Patient Stated Goals  get rid of back pain and prevent recurrent pain    Currently in Pain?  Yes    Pain Score  3     Pain Location  Back    Pain Orientation  Left;Right;Lower    Pain Descriptors / Indicators  Sore;Tightness    Aggravating Factors   walking    Pain Relieving Factors  stretches, hot baths         [facility] PT Assessment - 05/14/19 0001      Assessment   Medical Diagnosis  LBP    Referring Provider (PT)  Dr Dianah Field     Onset Date/Surgical Date  04/04/19    Hand Dominance  Right    Next MD Visit  05/18/19    Prior Therapy  none        [facility] Adult PT Treatment/Exercise - 05/14/19 0001      Lumbar Exercises: Stretches   Passive Hamstring Stretch  Right;Left;3 reps;30 seconds   supine with strap   Passive Hamstring Stretch Limitations  pain  in LB when raising leg 0-45 deg     Hip Flexor Stretch  Left;2 reps;20 seconds   Thomas position   Piriformis Stretch  Right;Left;1 rep;30 seconds   modified pigeon pose   Other Lumbar Stretch Exercise  pigeon pose with blocks under buttocks x 30 sec each side; childs pose x 60 sec x 2 reps       Lumbar Exercises: Aerobic   Tread Mill  5 min 1.0-2.5 mph  - and laps around gym in between exercises to decrease stiffness.      Lumbar Exercises: Supine   Bridge  5 reps;5 seconds      Lumbar Exercises: Sidelying   Clam  Right;Left;10 reps    Hip Abduction  Right;Left;10 reps      Manual Therapy   Manual therapy comments  I strip of reg Rock tape applied to bilat SI joints and I strip perpendicular across both pieces with 50% stretch to decompress tissue, increase proprioception and decrease pain.     Soft tissue mobilization  IASTM to bilat lumbar paraspinals/QL/glute med  to decrease fascial restrictions             PT Education - 05/14/19 1033    Education Details  HEP updated    Person(s) Educated  Patient    Methods  Explanation;Handout;Demonstration;Tactile cues;Verbal cues    Comprehension  Verbalized understanding;Returned demonstration          PT Long Term Goals - 05/04/19 0950      PT LONG TERM GOAL #1   Title  Improve pelvic and lumbar alignment in standing and supine    Time  6    Period  Weeks    Status  New    Target Date  06/15/19      PT LONG TERM GOAL #2   Title  Decrease pain 50-75% allowing patient to return to normal functional and recreational activities    Time  6    Period  Weeks    Status  New    Target Date  06/15/19      PT LONG TERM GOAL #3   Title  Improve tissue extensibility with patient to demonstrate HS flexibility to 80 deg bilat; quad flexibility to heel ~ 6 inches from buttocks    Time  6    Period  Weeks    Status  New    Target Date  06/15/19      PT LONG TERM GOAL #4   Title  Independent in HEP    Time  6    Period   Weeks    Status  New    Target Date  06/15/19      PT LONG TERM GOAL #5   Title  Improve FOTO to </= 25% limitation    Time  6    Period  Weeks    Status  New    Target Date  06/15/19            Plan - 05/14/19 0954    Clinical Impression Statement  Positive Thomas test with Lt hip; added stretch to Lt hip flexor in this position.  Pt reported pain in bilat SI joints when raising a straight leg to initiate hamstring stretch in supine; improved tolerance when keeping knee flexed.Lt post hip tremulous with hip ext in bridge.   Pelvic stabilizing strengthening exercises initiated and tolerated well. Trial of Rock tape applied to LB.  Progressing towards LTGs.    Stability/Clinical Decision Making  Stable/Uncomplicated    Rehab Potential  Good    PT Frequency  2x / week    PT Duration  6 weeks    PT Treatment/Interventions  Patient/family education;ADLs/Self Care Home Management;Cryotherapy;Electrical Stimulation;Iontophoresis 4mg /ml Dexamethasone;Moist Heat;Traction;Ultrasound;Therapeutic activities;Therapeutic exercise;Neuromuscular re-education;Manual techniques;Dry needling;Taping    PT Next Visit Plan  assess response to tape; manual work/retaping as indicated;  progress hip /LB strengthening    PT Home Exercise Plan  [facility]    Consulted and Agree with Plan of Care  Patient       Patient will benefit from skilled therapeutic intervention in order to improve the following deficits and impairments:     Visit Diagnosis: Acute bilateral low back pain without sciatica  SI (sacroiliac) joint dysfunction  Other symptoms and signs involving the musculoskeletal system     Problem List Patient Active Problem List   Diagnosis Date Noted  . Lumbar degenerative disc disease 04/20/2019  . Leg length discrepancy 04/20/2019  . Latent tuberculosis by blood test 06/14/2016  . 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 Acne 06/14/2016  . Anxiety  06/14/2016   Mayer Camel,  PTA 05/14/19 10:39 AM  [facility] [address] [place], [place], [address] Phone: [phone]   Fax:  [phone]  Name: Cindy Vasquez MRN: [id number] Date of Birth: 10-Feb-1990

## 2019-05-14 NOTE — Patient Instructions (Signed)
Access Code: [email or web address]: [email or web address]: 04/23/2021Prepared by: [facility]erc[facility]  Prone Quadriceps Stretch with Strap - 2 x daily - 7 x weekly - 3 reps - 1 sets - 30 sec hold  Hooklying Hamstring Stretch with Strap - 2 x daily - 7 x weekly - 3 reps - 1 sets - 30 sec hold  Seated Table Piriformis Stretch - 1 x daily - 7 x weekly - 1 sets - 2 reps - 20-30 hold  Hip Flexor Stretch at Edge of Bed - 2 x daily - 7 x weekly - 1 sets - 2 reps - 20-30 hold  Supine Bridge - 1 x daily - 7 x weekly - 1 sets - 10 reps - 5 hold  Child's Pose with Sidebending - 2 x daily - 7 x weekly - 1 sets - 2 reps - 15 hold  Clamshell - 1 x daily - 7 x weekly - 1 sets - 10 reps  Sidelying Hip Abduction - 1 x daily - 7 x weekly - 1 sets - 10 reps

## 2019-05-15 ENCOUNTER — Ambulatory Visit (INDEPENDENT_AMBULATORY_CARE_PROVIDER_SITE_OTHER): Payer: BC Managed Care – PPO

## 2019-05-15 DIAGNOSIS — M5136 Other intervertebral disc degeneration, lumbar region: Secondary | ICD-10-CM

## 2019-05-18 ENCOUNTER — Ambulatory Visit (INDEPENDENT_AMBULATORY_CARE_PROVIDER_SITE_OTHER): Payer: BC Managed Care – PPO | Admitting: Sports Medicine

## 2019-05-18 ENCOUNTER — Other Ambulatory Visit: Payer: Self-pay

## 2019-05-18 ENCOUNTER — Encounter: Payer: Self-pay | Admitting: Physical Therapy

## 2019-05-18 ENCOUNTER — Ambulatory Visit: Payer: BC Managed Care – PPO | Admitting: Physical Therapy

## 2019-05-18 DIAGNOSIS — M545 Low back pain, unspecified: Secondary | ICD-10-CM

## 2019-05-18 DIAGNOSIS — M5136 Other intervertebral disc degeneration, lumbar region: Secondary | ICD-10-CM | POA: Diagnosis not present

## 2019-05-18 DIAGNOSIS — M51369 Other intervertebral disc degeneration, lumbar region without mention of lumbar back pain or lower extremity pain: Secondary | ICD-10-CM

## 2019-05-18 DIAGNOSIS — M533 Sacrococcygeal disorders, not elsewhere classified: Secondary | ICD-10-CM

## 2019-05-18 DIAGNOSIS — R29898 Other symptoms and signs involving the musculoskeletal system: Secondary | ICD-10-CM

## 2019-05-18 MED ORDER — GABAPENTIN 300 MG PO CAPS: ORAL_CAPSULE | ORAL | 3 refills | Status: AC

## 2019-05-18 NOTE — Assessment & Plan Note (Signed)
This pleasant 29 year old female returns, chronic low back pain, MRI did show significant and large disc herniations at the L4-L5 and L5-S1 level. She has improved considerably with physical therapy and continues to improve, we are going to add gabapentin to start out at nighttime and a slow up taper, but we are holding off on interventional treatment such as epidurals for now as she has not yet plateaued with physical therapy. I like to touch base with her in a virtual visit in 1 month to reevaluate symptoms and if persistence or plateauing of symptoms we will proceed with lumbar epidural. Also added restrictions for work.

## 2019-05-18 NOTE — Progress Notes (Signed)
    Procedures performed today:    None.  Independent interpretation of notes and tests performed by another provider:   None.  Brief History, Exam, Impression, and Recommendations:    Lumbar degenerative disc disease This pleasant 29 year old female returns, chronic low back pain, MRI did show significant and large disc herniations at the L4-L5 and L5-S1 level. She has improved considerably with physical therapy and continues to improve, we are going to add gabapentin to start out at nighttime and a slow up taper, but we are holding off on interventional treatment such as epidurals for now as she has not yet plateaued with physical therapy. I like to touch base with her in a virtual visit in 1 month to reevaluate symptoms and if persistence or plateauing of symptoms we will proceed with lumbar epidural. Also added restrictions for work.    ___________________________________________ Ihor Austin. Benjamin Stain, M.D., ABFM., CAQSM. Primary Care and Sports Medicine [facility]  Adjunct Instructor of Family Medicine  [employer]

## 2019-05-18 NOTE — Therapy (Signed)
[place], [place], [address] Phone: [phone]   Fax:  [phone]  Physical Therapy Treatment  Patient Details  Name: Cindy Vasquez MRN: [id number] Date of Birth: 07-27-1990 Referring Provider (PT): Dr Dianah Field    Encounter Date: 05/18/2019  PT End of Session - 05/18/19 0807    Visit Number  4    Number of Visits  12    Date for PT Re-Evaluation  06/15/19    PT Start Time  0802    PT Stop Time  0845    PT Time Calculation (min)  43 min    Activity Tolerance  Patient tolerated treatment well    Behavior During Therapy  [facility] for tasks assessed/performed       Past Medical History:  Diagnosis Date  . Acne   . Anxiety   . Back pain   . Psoriasis of nail     Past Surgical History:  Procedure Laterality Date  . NO PAST SURGERIES      There were no vitals filed for this visit.  Subjective Assessment - 05/18/19 0809    Subjective  Pt reports the Ktape helped reduce her pain with transitions during sleep.  She was able to play kickball this week without pain afterwards.  She continues to have stiffness in low back upon waking; reduced with stretches.  Lt ant hip not as achy.  Pt reporting 60% improvement in pain since starting therapy.    Patient Stated Goals  get rid of back pain and prevent recurrent pain    Currently in Pain?  Yes    Pain Score  1     Pain Location  Back    Pain Orientation  Left;Lower    Pain Descriptors / Indicators  Tightness;Sore    Aggravating Factors   sleeping    Pain Relieving Factors  stretches, hot baths.         [facility] PT Assessment - 05/18/19 0001      Assessment   Medical Diagnosis  LBP    Referring Provider (PT)  Dr Dianah Field     Onset Date/Surgical Date  04/04/19    Hand Dominance  Right    Next MD Visit  05/18/19    Prior Therapy  none       [facility] Adult PT Treatment/Exercise - 05/18/19 0001      Lumbar Exercises: Stretches   Passive  Hamstring Stretch  Right;Left;3 reps;30 seconds   supine with strap   Hip Flexor Stretch  Left;2 reps;20 seconds   Thomas position   Press Ups  2 reps;5 seconds    Quad Stretch  Right;Left;1 rep;10 seconds   standing, heel to buttocks   Piriformis Stretch  Right;Left;1 rep;30 seconds   supine   Other Lumbar Stretch Exercise  childs pose x 10 sec to prone press up x 5 sec, 3 sets      Lumbar Exercises: Aerobic   Tread Mill  5 min 2.0-2.19mh      Lumbar Exercises: Sidelying   Clam  Right;Left;10 reps    Clam Limitations  regular and reverse     Hip Abduction  Right;Left;10 reps    Hip Abduction Limitations  followed by hip abdct rainbows x 10       Lumbar Exercises: Prone   Other Prone Lumbar Exercises  prone pelvic press x 5 sec x 5 reps; pelvic press with unilateral knee bends x 5 reps each leg.  Manual Therapy   Manual therapy comments  I strip of reg Rock tape applied to bilat SI joints and I strip perpendicular across both pieces with 50% stretch to decompress tissue, increase proprioception and decrease pain.                   PT Long Term Goals - 05/18/19 0901      PT LONG TERM GOAL #1   Title  Improve pelvic and lumbar alignment in standing and supine    Time  6    Period  Weeks    Status  On-going      PT LONG TERM GOAL #2   Title  Decrease pain 50-75% allowing patient to return to normal functional and recreational activities    Time  6    Period  Weeks    Status  Achieved      PT LONG TERM GOAL #3   Title  Improve tissue extensibility with patient to demonstrate HS flexibility to 80 deg bilat; quad flexibility to heel ~ 6 inches from buttocks    Time  6    Period  Weeks    Status  Partially Met      PT LONG TERM GOAL #4   Title  Independent in HEP    Time  6    Period  Weeks    Status  On-going      PT LONG TERM GOAL #5   Title  Improve FOTO to </= 25% limitation    Time  6    Period  Weeks    Status  On-going            Plan  - 05/18/19 0825    Clinical Impression Statement  Positive response felt with Ktape applied to lower back. Pt able to tolerate hip exercises well without increase in back pain.  Lt posterior hip continues to be tremulous with hip ext exercises. She reports some increase in LBP (at L5 region) with increased heel strike during warm up.  She has met LTG#2, partially met LTG #3.    Stability/Clinical Decision Making  Stable/Uncomplicated    Rehab Potential  Good    PT Frequency  2x / week    PT Duration  6 weeks    PT Treatment/Interventions  Patient/family education;ADLs/Self Care Home Management;Cryotherapy;Electrical Stimulation;Iontophoresis '4mg'$ /ml Dexamethasone;Moist Heat;Traction;Ultrasound;Therapeutic activities;Therapeutic exercise;Neuromuscular re-education;Manual techniques;Dry needling;Taping    PT Next Visit Plan  manual work/retaping as indicated;  progress hip /LB strengthening    PT Home Exercise Plan  [facility]    Consulted and Agree with Plan of Care  Patient       Patient will benefit from skilled therapeutic intervention in order to improve the following deficits and impairments:  Difficulty walking, Increased fascial restricitons, Pain, Decreased mobility, Decreased strength, Postural dysfunction, Impaired flexibility  Visit Diagnosis: Acute bilateral low back pain without sciatica  SI (sacroiliac) joint dysfunction  Other symptoms and signs involving the musculoskeletal system     Problem List Patient Active Problem List   Diagnosis Date Noted  . Lumbar degenerative disc disease 04/20/2019  . Leg length discrepancy 04/20/2019  . Latent tuberculosis by blood test 06/14/2016  . 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 Acne 06/14/2016  . Anxiety 06/14/2016   Kerin Perna, PTA 05/18/19 9:03 AM  [place], [place], [address] Phone: [phone]   Fax:  [phone]  Name: Cindy Vasquez MRN: [id number] Date of Birth: 07-20-1990

## 2019-05-21 ENCOUNTER — Ambulatory Visit: Payer: BC Managed Care – PPO | Admitting: Physical Therapy

## 2019-05-21 ENCOUNTER — Other Ambulatory Visit: Payer: Self-pay

## 2019-05-21 DIAGNOSIS — M533 Sacrococcygeal disorders, not elsewhere classified: Secondary | ICD-10-CM | POA: Diagnosis not present

## 2019-05-21 DIAGNOSIS — M545 Low back pain, unspecified: Secondary | ICD-10-CM

## 2019-05-21 DIAGNOSIS — R29898 Other symptoms and signs involving the musculoskeletal system: Secondary | ICD-10-CM | POA: Diagnosis not present

## 2019-05-21 NOTE — Patient Instructions (Signed)
Access Code: [email or web address]: [email or web address]: 04/30/2021Prepared by: [facility]ercises  Prone Quadriceps Stretch with Strap - 2 x daily - 7 x weekly - 3 reps - 1 sets - 30 sec hold  Hooklying Hamstring Stretch with Strap - 2 x daily - 7 x weekly - 3 reps - 1 sets - 30 sec hold  Seated Table Piriformis Stretch - 1 x daily - 7 x weekly - 1 sets - 2 reps - 20-30 hold  Hip Flexor Stretch at Edge of Bed - 2 x daily - 7 x weekly - 1 sets - 2 reps - 20-30 hold  Supine Bridge - 1 x daily - 7 x weekly - 1 sets - 10 reps - 5 hold  Child's Pose with Sidebending - 2 x daily - 7 x weekly - 1 sets - 2 reps - 15 hold  Clamshell - 1 x daily - 7 x weekly - 1 sets - 10 reps  Sidelying Hip Abduction - 1 x daily - 7 x weekly - 1 sets - 10 reps  Prone Alternating Arm and Leg Lifts - 1 x daily - 7 x weekly - 1 sets - 10 reps - 2 hold  Full Plank - 1 x daily - 3 x weekly - 3 sets - 2 reps - 10 hold  Primal Push Up - 1 x daily - 3 x weekly - 1 sets - 5 reps - 5 hold

## 2019-05-21 NOTE — Therapy (Signed)
[place], [place], [address] Phone: [phone]   Fax:  [phone]  Physical Therapy Treatment  Patient Details  Name: Cindy Vasquez MRN: [id number] Date of Birth: 08-Sep-1990 Referring Provider (PT): Dr Dianah Field    Encounter Date: 05/21/2019  PT End of Session - 05/21/19 1506    Visit Number  5    Number of Visits  12    Date for PT Re-Evaluation  06/15/19    PT Start Time  1449    PT Stop Time  1535    PT Time Calculation (min)  46 min    Activity Tolerance  Patient tolerated treatment well    Behavior During Therapy  [facility] for tasks assessed/performed       Past Medical History:  Diagnosis Date  . Acne   . Anxiety   . Back pain   . Psoriasis of nail     Past Surgical History:  Procedure Laterality Date  . NO PAST SURGERIES      There were no vitals filed for this visit.  Subjective Assessment - 05/21/19 1507    Subjective  Pt reports she was sitting and twisted to the Rt to grab something from floor and heard a pop in her back. No immediated pain. Her back has been tender since then. She plans to start Gabapentin this weekend to assess response.    Patient Stated Goals  get rid of back pain and prevent recurrent pain    Currently in Pain?  Yes    Pain Score  3     Pain Location  Back    Pain Orientation  Left;Right;Lower    Pain Descriptors / Indicators  Tender         [facility] PT Assessment - 05/21/19 0001      Assessment   Medical Diagnosis  LBP    Referring Provider (PT)  Dr Dianah Field     Onset Date/Surgical Date  04/04/19    Hand Dominance  Right    Next MD Visit  05/18/19    Prior Therapy  none      Special Tests    Special Tests  Hip Special Tests    Hip Special Tests   [facility] Test    Findings  Positive    Side  Left    Comments  7 deg       [facility] Adult PT Treatment/Exercise - 05/21/19 0001      Lumbar Exercises: Stretches   Hip Flexor  Stretch  Left;2 reps;20 seconds   Thomas position   Prone on Elbows Stretch  20 seconds;2 reps    Other Lumbar Stretch Exercise  childs pose x 10, then with lateral trunk flexion x 5-10 sec; 2 sets.       Lumbar Exercises: Aerobic   Tread Mill  5 min at 2.63mh      Lumbar Exercises: Seated   Other Seated Lumbar Exercises  seated on 75cm physioball - gentle bouncing.  gentle posterior lean with back straight and core engaged x 5 sec; repeated with alternating shoulder flex to 90 deg x 5 reps each arm.       Lumbar Exercises: Prone   Opposite Arm/Leg Raise  5 reps;Left arm/Right leg;Right arm/Left leg   2 sets   Other Prone Lumbar Exercises  prone pelvic press x 5 sec x 5 reps; pelvic press with unilateral knee bends x 5 reps each leg, 5  reps bilat knee bands, 1 rep hip ext with knee flexed.       Lumbar Exercises: Quadruped   Opposite Arm/Leg Raise  Right arm/Left leg;Left arm/Right leg;5 reps    Opposite Arm/Leg Raise Limitations  pain in Lt low back with Lt foot off mat, improved tolerance with toes on mat.     Plank  counter top plank x 15 sec; full plank on low black mat x 15 sec (cues for neutral pelvis)     Other Quadruped Lumbar Exercises  primal push up x 5 sec x 2 reps             PT Education - 05/21/19 1543    Education Details  HEP    Person(s) Educated  Patient    Methods  Explanation;Handout;Demonstration;Tactile cues;Verbal cues    Comprehension  Verbalized understanding;Returned demonstration          PT Long Term Goals - 05/18/19 0901      PT LONG TERM GOAL #1   Title  Improve pelvic and lumbar alignment in standing and supine    Time  6    Period  Weeks    Status  On-going      PT LONG TERM GOAL #2   Title  Decrease pain 50-75% allowing patient to return to normal functional and recreational activities    Time  6    Period  Weeks    Status  Achieved      PT LONG TERM GOAL #3   Title  Improve tissue extensibility with patient to demonstrate HS  flexibility to 80 deg bilat; quad flexibility to heel ~ 6 inches from buttocks    Time  6    Period  Weeks    Status  Partially Met      PT LONG TERM GOAL #4   Title  Independent in HEP    Time  6    Period  Weeks    Status  On-going      PT LONG TERM GOAL #5   Title  Improve FOTO to </= 25% limitation    Time  6    Period  Weeks    Status  On-going            Plan - 05/21/19 1511    Clinical Impression Statement  Pt's LLE fatigues quickly with core stabilization exercises.  Otherwise all exercises tolerated well.   Pelvic press with hip ext too difficult, but single and double knee bends were tolerated well.   Updated HEP with some of today's exercises. Tape held due to irritation in skin from prior removal.  Progressing well towards goals.    Stability/Clinical Decision Making  Stable/Uncomplicated    Rehab Potential  Good    PT Frequency  2x / week    PT Duration  6 weeks    PT Treatment/Interventions  Patient/family education;ADLs/Self Care Home Management;Cryotherapy;Electrical Stimulation;Iontophoresis '4mg'$ /ml Dexamethasone;Moist Heat;Traction;Ultrasound;Therapeutic activities;Therapeutic exercise;Neuromuscular re-education;Manual techniques;Dry needling;Taping    PT Next Visit Plan  manual work/retaping as indicated;  progress hip /LB strengthening    PT Home Exercise Plan  [facility]    Consulted and Agree with Plan of Care  Patient       Patient will benefit from skilled therapeutic intervention in order to improve the following deficits and impairments:  Difficulty walking, Increased fascial restricitons, Pain, Decreased mobility, Decreased strength, Postural dysfunction, Impaired flexibility  Visit Diagnosis: Acute bilateral low back pain without sciatica  SI (sacroiliac) joint dysfunction  Other symptoms  and signs involving the musculoskeletal system     Problem List Patient Active Problem List   Diagnosis Date Noted  . Lumbar degenerative disc disease  04/20/2019  . Leg length discrepancy 04/20/2019  . Latent tuberculosis by blood test 06/14/2016  . 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 Acne 06/14/2016  . Anxiety 06/14/2016   Kerin Perna, PTA 05/21/19 4:27 PM  [place], [place], [address] Phone: [phone]   Fax:  [phone]  Name: Cindy Vasquez MRN: [id number] Date of Birth: February 03, 1990

## 2019-05-24 ENCOUNTER — Other Ambulatory Visit: Payer: Self-pay

## 2019-05-24 ENCOUNTER — Encounter: Payer: BC Managed Care – PPO | Admitting: Physical Therapy

## 2019-05-24 ENCOUNTER — Telehealth: Payer: Self-pay | Admitting: Physical Therapy

## 2019-05-24 NOTE — Telephone Encounter (Signed)
Patient didn't show for PT appt.  Called patient's number and left HIPAA compliant voice mail to return call to [facility] at [phone].  Mayer Camel, PTA 05/24/19 4:22 PM

## 2019-05-28 ENCOUNTER — Other Ambulatory Visit: Payer: Self-pay

## 2019-05-28 ENCOUNTER — Ambulatory Visit: Payer: BC Managed Care – PPO | Admitting: Physical Therapy

## 2019-05-28 DIAGNOSIS — M533 Sacrococcygeal disorders, not elsewhere classified: Secondary | ICD-10-CM

## 2019-05-28 DIAGNOSIS — R29898 Other symptoms and signs involving the musculoskeletal system: Secondary | ICD-10-CM | POA: Diagnosis not present

## 2019-05-28 DIAGNOSIS — M545 Low back pain, unspecified: Secondary | ICD-10-CM

## 2019-05-28 NOTE — Therapy (Signed)
[place], [place], [address] Phone: [phone]   Fax:  [phone]  Physical Therapy Treatment  Patient Details  Name: Cindy Vasquez MRN: [id number] Date of Birth: 03-17-1990 Referring Provider (PT): Dr Dianah Field    Encounter Date: 05/28/2019  PT End of Session - 05/28/19 0915    Visit Number  6    Number of Visits  12    Date for PT Re-Evaluation  06/15/19    PT Start Time  0850    PT Stop Time  0933    PT Time Calculation (min)  43 min    Activity Tolerance  Patient tolerated treatment well    Behavior During Therapy  [facility] for tasks assessed/performed       Past Medical History:  Diagnosis Date  . Acne   . Anxiety   . Back pain   . Psoriasis of nail     Past Surgical History:  Procedure Laterality Date  . NO PAST SURGERIES      There were no vitals filed for this visit.  Subjective Assessment - 05/28/19 1237    Subjective  Cindy Vasquez reports she has had weird dreams since starting Gabapentin; woke with tightening of back with dream.  She doesn't feel like she has recovered from the incident in back last week.  She has to use log roll to move in bed, otherwise has more pain.   Over last 2 days, the hip flexor stretch and hamstring stretch have been more difficult.    Patient Stated Goals  get rid of back pain and prevent recurrent pain    Currently in Pain?  No/denies    Pain Score  0-No pain         [facility] PT Assessment - 05/28/19 0001      Assessment   Medical Diagnosis  LBP    Referring Provider (PT)  Dr Dianah Field     Onset Date/Surgical Date  04/04/19    Hand Dominance  Right    Next MD Visit  05/18/19    Prior Therapy  none      Special Tests    Special Tests  Leg LengthTest    Leg length test   True      True   Right  89.5 in.    Left   88.5 in.    Comments  pt issued heel lift for LLE (medium 0.5)       Thomas Test    Side  Right;Left    Comments  12 deg; 12  deg   after stretch, Lt = 5 deg, Rt = 9      [facility] Adult PT Treatment/Exercise - 05/28/19 0001      Lumbar Exercises: Stretches   Passive Hamstring Stretch  Right;1 rep;10 seconds   leg on wall, LLE through door frame   Passive Hamstring Stretch Limitations  bilat LE on wall for hamstring stretch x 10 sec (for demo of what she is performing at home)    Hip Flexor Stretch  Left;Right;2 reps;20 seconds   Thomas position   Hip Flexor Stretch Limitations  high kneeling single leg with arm over head for psoas stretch x 20 sec each side.     Other Lumbar Stretch Exercise  childs pose x 10, then with lateral trunk flexion x 5-10 sec; 2 sets.       Lumbar Exercises: Aerobic   Tread Mill  5 min at 2.4 mph  Lumbar Exercises: Sidelying   Other Sidelying Lumbar Exercises  modified side plank x 5 sec, 3 reps each side.       Lumbar Exercises: Prone   Other Prone Lumbar Exercises  prone pelvic press x 5 sec x 5 reps; pelvic press with unilateral knee bends x 5 reps each leg, 5 reps bilat knee bands.    Other Prone Lumbar Exercises  goal post with axial ext x 5, repeated with goal post to superman x 8 reps      Lumbar Exercises: Fayne Mediate  verbally reviewed; encouraged progression to 10 second holds.       Manual Therapy   Manual therapy comments  I strip of sensitive skin Rock tape applied to bilat SI joints and I strip perpendicular across both pieces with 50% stretch to decompress tissue, increase proprioception and decrease pain.     Soft tissue mobilization  IASTM to bilat lumbar paraspinals/QL to decrease fascial restrictions                  PT Long Term Goals - 05/18/19 0901      PT LONG TERM GOAL #1   Title  Improve pelvic and lumbar alignment in standing and supine    Time  6    Period  Weeks    Status  On-going      PT LONG TERM GOAL #2   Title  Decrease pain 50-75% allowing patient to return to normal functional and recreational activities    Time  6     Period  Weeks    Status  Achieved      PT LONG TERM GOAL #3   Title  Improve tissue extensibility with patient to demonstrate HS flexibility to 80 deg bilat; quad flexibility to heel ~ 6 inches from buttocks    Time  6    Period  Weeks    Status  Partially Met      PT LONG TERM GOAL #4   Title  Independent in HEP    Time  6    Period  Weeks    Status  On-going      PT LONG TERM GOAL #5   Title  Improve FOTO to </= 25% limitation    Time  6    Period  Weeks    Status  On-going            Plan - 05/28/19 1053    Clinical Impression Statement  Pt tolerating prone spine stablization exercises well without difficulty or pain.  She responds well to tape; retaped today. Trial of heel lift due to leg length difference.  Pt continues to progress towards LTGs.    Stability/Clinical Decision Making  Stable/Uncomplicated    Rehab Potential  Good    PT Frequency  2x / week    PT Duration  6 weeks    PT Treatment/Interventions  Patient/family education;ADLs/Self Care Home Management;Cryotherapy;Electrical Stimulation;Iontophoresis '4mg'$ /ml Dexamethasone;Moist Heat;Traction;Ultrasound;Therapeutic activities;Therapeutic exercise;Neuromuscular re-education;Manual techniques;Dry needling;Taping    PT Next Visit Plan  manual work/retaping as indicated;  progress hip /LB strengthening    PT Home Exercise Plan  [facility]    Consulted and Agree with Plan of Care  Patient       Patient will benefit from skilled therapeutic intervention in order to improve the following deficits and impairments:  Difficulty walking, Increased fascial restricitons, Pain, Decreased mobility, Decreased strength, Postural dysfunction, Impaired flexibility  Visit Diagnosis: Acute bilateral low back pain without sciatica  SI (sacroiliac) joint dysfunction  Other symptoms and signs involving the musculoskeletal system     Problem List Patient Active Problem List   Diagnosis Date Noted  . Lumbar degenerative  disc disease 04/20/2019  . Leg length discrepancy 04/20/2019  . Latent tuberculosis by blood test 06/14/2016  . 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 Acne 06/14/2016  . Anxiety 06/14/2016   Kerin Perna, PTA 05/28/19 12:42 PM  [place], [place], [address] Phone: [phone]   Fax:  [phone]  Name: Cindy Vasquez MRN: [id number] Date of Birth: 02/15/1990

## 2019-06-04 ENCOUNTER — Encounter: Payer: Self-pay | Admitting: Physical Therapy

## 2019-06-04 ENCOUNTER — Other Ambulatory Visit: Payer: Self-pay

## 2019-06-04 ENCOUNTER — Ambulatory Visit (INDEPENDENT_AMBULATORY_CARE_PROVIDER_SITE_OTHER): Payer: BC Managed Care – PPO | Admitting: Physical Therapy

## 2019-06-04 DIAGNOSIS — M545 Low back pain, unspecified: Secondary | ICD-10-CM

## 2019-06-04 DIAGNOSIS — M533 Sacrococcygeal disorders, not elsewhere classified: Secondary | ICD-10-CM

## 2019-06-04 DIAGNOSIS — R29898 Other symptoms and signs involving the musculoskeletal system: Secondary | ICD-10-CM | POA: Diagnosis not present

## 2019-06-04 NOTE — Therapy (Addendum)
[place], [place], [address] Phone: [phone]   Fax:  [phone]  Physical Therapy Treatment  Patient Details  Name: Cindy Vasquez MRN: [id number] Date of Birth: November 01, 1990 Referring Provider (PT): Dr Dianah Field    Encounter Date: 06/04/2019  PT End of Session - 06/04/19 1028    Visit Number  7    Number of Visits  12    Date for PT Re-Evaluation  06/15/19    PT Start Time  1017    PT Stop Time  1055    PT Time Calculation (min)  38 min    Activity Tolerance  Patient tolerated treatment well    Behavior During Therapy  [facility] for tasks assessed/performed       Past Medical History:  Diagnosis Date  . Acne   . Anxiety   . Back pain   . Psoriasis of nail     Past Surgical History:  Procedure Laterality Date  . NO PAST SURGERIES      There were no vitals filed for this visit.  Subjective Assessment - 06/04/19 1046    Subjective  Pt reports she is only taking Gabapentin randomly; makes her too groggy the following day.  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  She has not tried running yet.    Currently in Pain?  No/denies    Pain Score  0-No pain         [facility] PT Assessment - 06/04/19 0001      Assessment   Medical Diagnosis  LBP    Referring Provider (PT)  Dr Dianah Field     Onset Date/Surgical Date  04/04/19    Hand Dominance  Right    Prior Therapy  none      [facility] Adult PT Treatment/Exercise - 06/04/19 0001      Lumbar Exercises: Stretches   Passive Hamstring Stretch  Right;Left;2 reps;30 seconds   supine with strap   Hip Flexor Stretch  Left;Right;2 reps;20 seconds   Thomas position   Piriformis Stretch  Right;Left;1 rep;30 seconds    Other Lumbar Stretch Exercise  childs pose x 10, then with lateral trunk flexion x 5-10 sec; 2 sets.       Lumbar Exercises: Aerobic   Elliptical  L1: 3 min     Tread Mill  4.5 min at 2.5 mph      Lumbar  Exercises: Standing   Wall Slides  5 reps   with 5 reps of 5# wt in/out from core   Other Standing Lumbar Exercises  anti-rotation side stepping with green band x 10 reps each side (with/without band in/out from core)     Lumbar Exercises: Quadruped   Straight Leg Raises Limitations  donkey kicks x 5 reps each side - cues for neutral spine.     Other Quadruped Lumbar Exercises  high kneeling, ball roll outs with straight back, arms on green physioball x 10 reps.   5 reps of fire hydrants, cues for neutral spine and core engaged.      Other Quadruped Lumbar Exercises  bird dog with belly on green pball x 5 sec hold x 5 reps each side.  then axial ext and goal post arms x 10 reps.            PT Long Term Goals - 06/04/19 1100      PT LONG TERM GOAL #1   Title  Improve pelvic and lumbar alignment in  standing and supine    Time  6    Period  Weeks    Status  Partially Met      PT LONG TERM GOAL #2   Title  Decrease pain 50-75% allowing patient to return to normal functional and recreational activities    Time  6    Period  Weeks    Status  Achieved      PT LONG TERM GOAL #3   Title  Improve tissue extensibility with patient to demonstrate HS flexibility to 80 deg bilat; quad flexibility to heel ~ 6 inches from buttocks    Time  6    Period  Weeks    Status  Partially Met      PT LONG TERM GOAL #4   Title  Independent in HEP    Time  6    Period  Weeks    Status  On-going      PT LONG TERM GOAL #5   Title  Improve FOTO to </= 25% limitation    Time  6    Period  Weeks    Status  On-going            Plan - 06/04/19 1101    Clinical Impression Statement  Pt tolerated core exercises along with stretches well, without increase in lumbar pain.  Educated pt to avoid rotation exercises and stretches, as this is an aggrivating factor of back pain.  Pt making good gains each visit.  Pt now reporting 70% improvement since starting therapy.    Stability/Clinical Decision Making   Stable/Uncomplicated    Rehab Potential  Good    PT Frequency  2x / week    PT Duration  6 weeks    PT Treatment/Interventions  Patient/family education;ADLs/Self Care Home Management;Cryotherapy;Electrical Stimulation;Iontophoresis '4mg'$ /ml Dexamethasone;Moist Heat;Traction;Ultrasound;Therapeutic activities;Therapeutic exercise;Neuromuscular re-education;Manual techniques;Dry needling;Taping    PT Next Visit Plan  assess response to heel lift.  update HEP.  FOTO.    PT Home Exercise Plan  [facility]    Consulted and Agree with Plan of Care  Patient       Patient will benefit from skilled therapeutic intervention in order to improve the following deficits and impairments:  Difficulty walking, Increased fascial restricitons, Pain, Decreased mobility, Decreased strength, Postural dysfunction, Impaired flexibility  Visit Diagnosis: Acute bilateral low back pain without sciatica  SI (sacroiliac) joint dysfunction  Other symptoms and signs involving the musculoskeletal system     Problem List Patient Active Problem List   Diagnosis Date Noted  . Lumbar degenerative disc disease 04/20/2019  . Leg length discrepancy 04/20/2019  . Latent tuberculosis by blood test 06/14/2016  . 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 Acne 06/14/2016  . Anxiety 06/14/2016   Kerin Perna, PTA 06/04/19 11:05 AM  [place], [place], [address] Phone: [phone]   Fax:  [phone]  Name: Cindy Vasquez MRN: [id number] Date of Birth: March 15, 1990  PHYSICAL THERAPY DISCHARGE SUMMARY  Visits from Start of Care: 7  Current functional level related to goals / functional outcomes: See last progress note for discharge status   Remaining deficits: Unknown    Education / Equipment: HEP  Plan: Patient agrees to discharge.  Patient goals were met. Patient is being discharged due to being pleased with the current functional  level.  ?????    Celyn P. Helene Kelp PT, MPH 07/22/19 4:10 PM

## 2019-06-11 ENCOUNTER — Encounter: Payer: BC Managed Care – PPO | Admitting: Physical Therapy

## 2019-06-15 ENCOUNTER — Encounter: Payer: BC Managed Care – PPO | Admitting: Physical Therapy

## 2019-06-15 ENCOUNTER — Telehealth (INDEPENDENT_AMBULATORY_CARE_PROVIDER_SITE_OTHER): Payer: BC Managed Care – PPO | Admitting: Sports Medicine

## 2019-06-15 DIAGNOSIS — M5136 Other intervertebral disc degeneration, lumbar region: Secondary | ICD-10-CM | POA: Diagnosis not present

## 2019-06-15 DIAGNOSIS — M51369 Other intervertebral disc degeneration, lumbar region without mention of lumbar back pain or lower extremity pain: Secondary | ICD-10-CM

## 2019-06-15 NOTE — Progress Notes (Signed)
   Virtual Visit via Telephone   I connected with  Cindy Vasquez  on 06/15/19 by telephone/telehealth and verified that I am speaking with the correct person using two identifiers.   I discussed the limitations, risks, security and privacy concerns of performing an evaluation and management service by telephone, including the higher likelihood of inaccurate diagnosis and treatment, and the availability of in person appointments.  We also discussed the likely need of an additional face to face encounter for complete and high quality delivery of care.  I also discussed with the patient that there may be a patient responsible charge related to this service. The patient expressed understanding and wishes to proceed.  Provider location is either at home or medical facility. Patient location is at their home, different from provider location. People involved in care of the patient during this telehealth encounter were myself, my nurse/medical assistant, and my front office/scheduling team member.  Review of Systems: No fevers, chills, night sweats, weight loss, chest pain, or shortness of breath.   Objective Findings:    General: Speaking full sentences, no audible heavy breathing.  Sounds alert and appropriately interactive.    Independent interpretation of tests performed by another provider:   None.  Brief History, Exam, Impression, and Recommendations:    Lumbar degenerative disc disease This is a very pleasant 29 year old female, she has chronic low back pain, we obtained an MRI that showed significant and large disc herniations at the L4-L5 and L5-S1 levels. At the last visit she had improved considerably with physical therapy, and had not yet plateaued, I added gabapentin, she currently takes it at 1 pill during the day and 2 pills at night and reports 100% resolution of all of her pain. Continue current dosing, she does understand that she can play around with the dosage up to a maximum  of 3600 mg/day and we will avoid lumbar epidurals for now, I am lifting all of her restrictions, return to see me as needed.   I discussed the above assessment and treatment plan with the patient. The patient was provided an opportunity to ask questions and all were answered. The patient agreed with the plan and demonstrated an understanding of the instructions.   The patient was advised to call back or seek an in-person evaluation if the symptoms worsen or if the condition fails to improve as anticipated.   I provided 30 minutes of face to face and non-face-to-face time during this encounter date, time was needed to gather information, review chart, records, communicate/coordinate with staff remotely, as well as complete documentation.   ___________________________________________ Ihor Austin. Benjamin Stain, M.D., ABFM., CAQSM. Primary Care and Sports Medicine [facility]  Adjunct Professor of Family Medicine  [employer]

## 2019-06-15 NOTE — Assessment & Plan Note (Signed)
This is a very pleasant 29 year old female, she has chronic low back pain, we obtained an MRI that showed significant and large disc herniations at the L4-L5 and L5-S1 levels. At the last visit she had improved considerably with physical therapy, and had not yet plateaued, I added gabapentin, she currently takes it at 1 pill during the day and 2 pills at night and reports 100% resolution of all of her pain. Continue current dosing, she does understand that she can play around with the dosage up to a maximum of 3600 mg/day and we will avoid lumbar epidurals for now, I am lifting all of her restrictions, return to see me as needed.

## 2020-09-08 IMAGING — DX DG LUMBAR SPINE COMPLETE 4+V
5 series · 5 of 5 positions shown · non-contrast
Comparison: None.

CLINICAL DATA: Acute onset of right low back pain for days ago
after sprinting. No constant pain.

EXAM:
LUMBAR SPINE - COMPLETE 4+ VIEW

[l-spine ap]
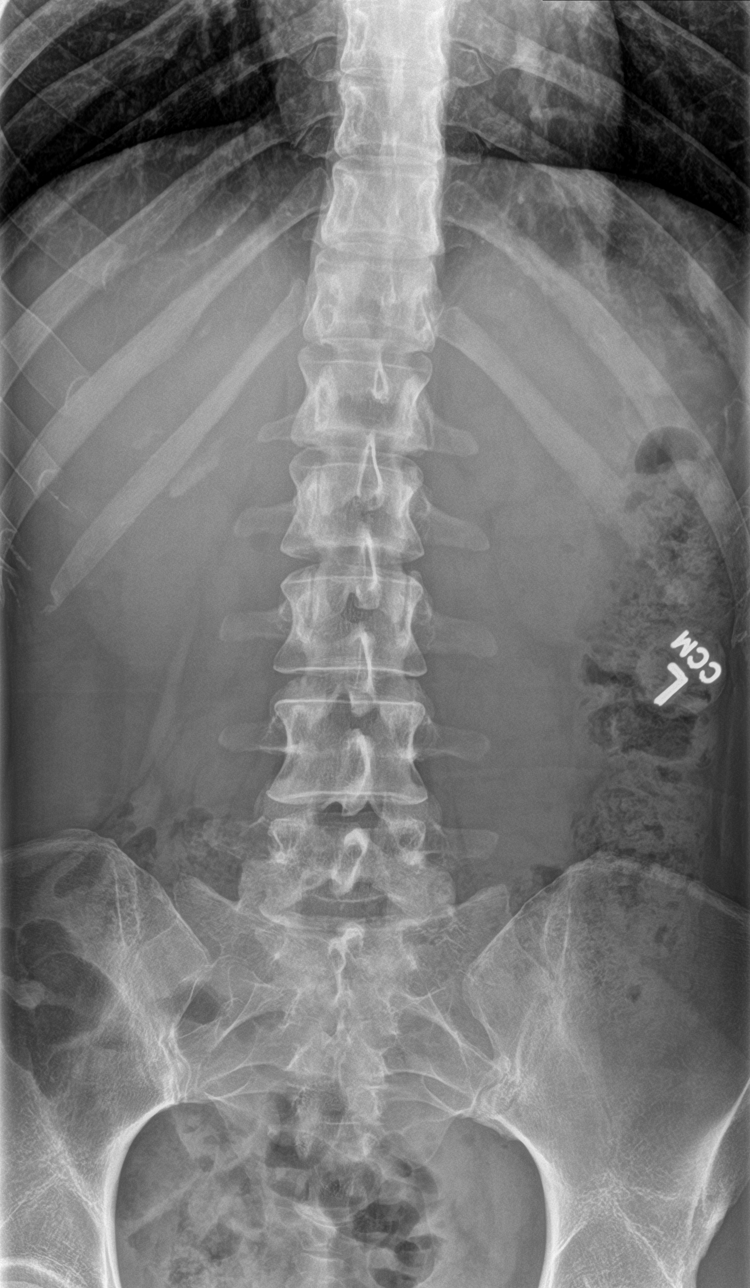

[l-spine obl (1 of 2)]
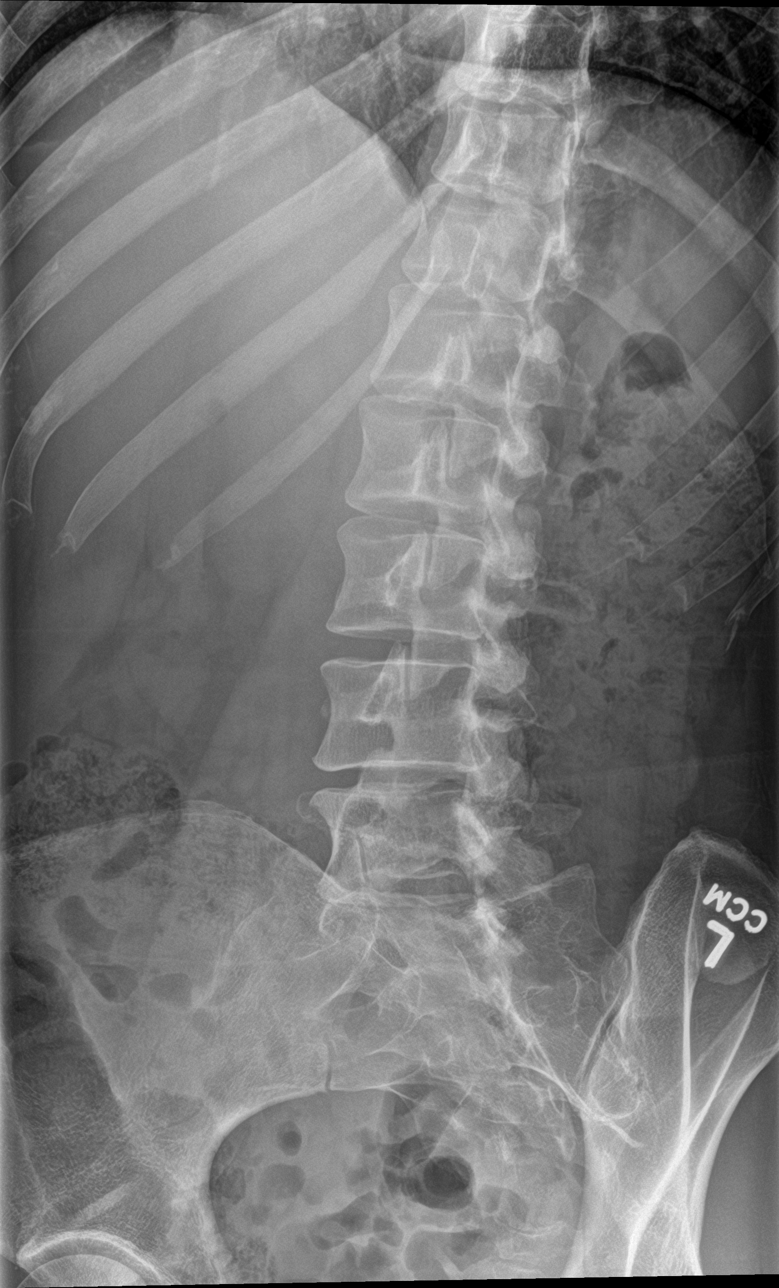

[l-spine obl (2 of 2)]
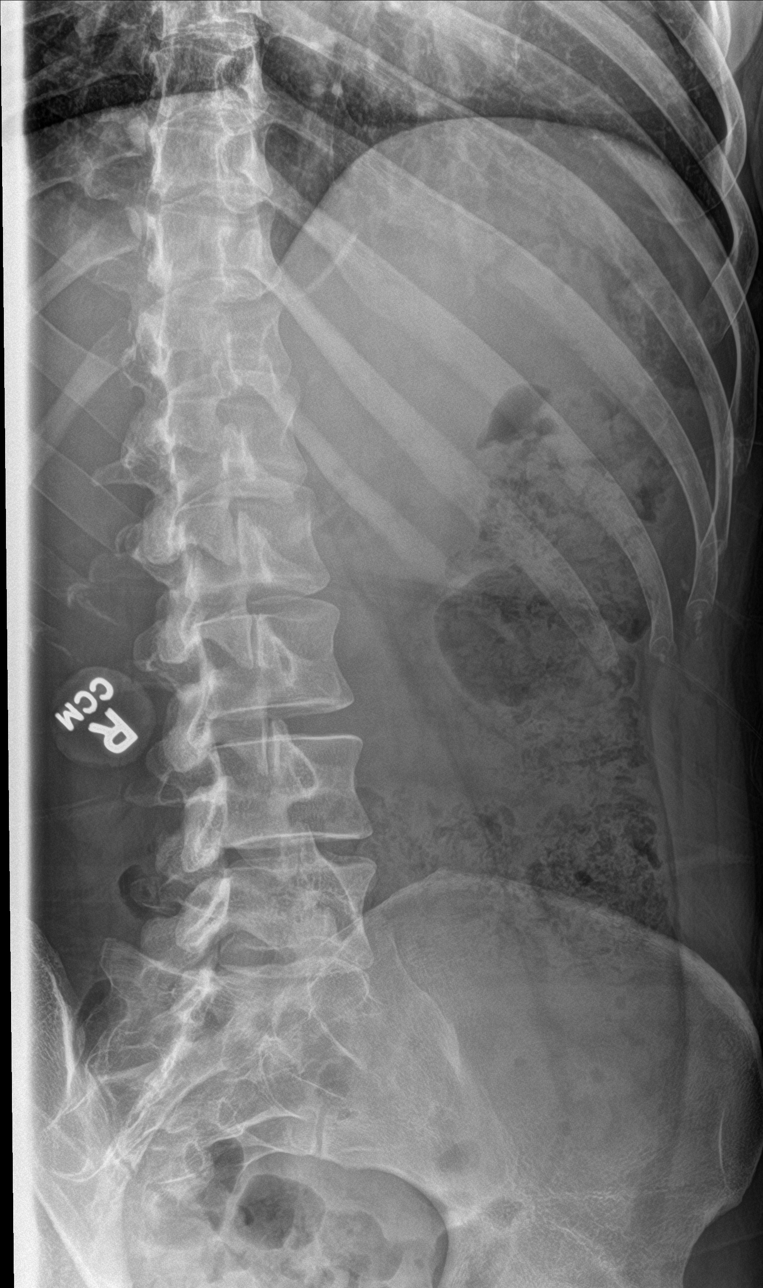

[l-spine lat]
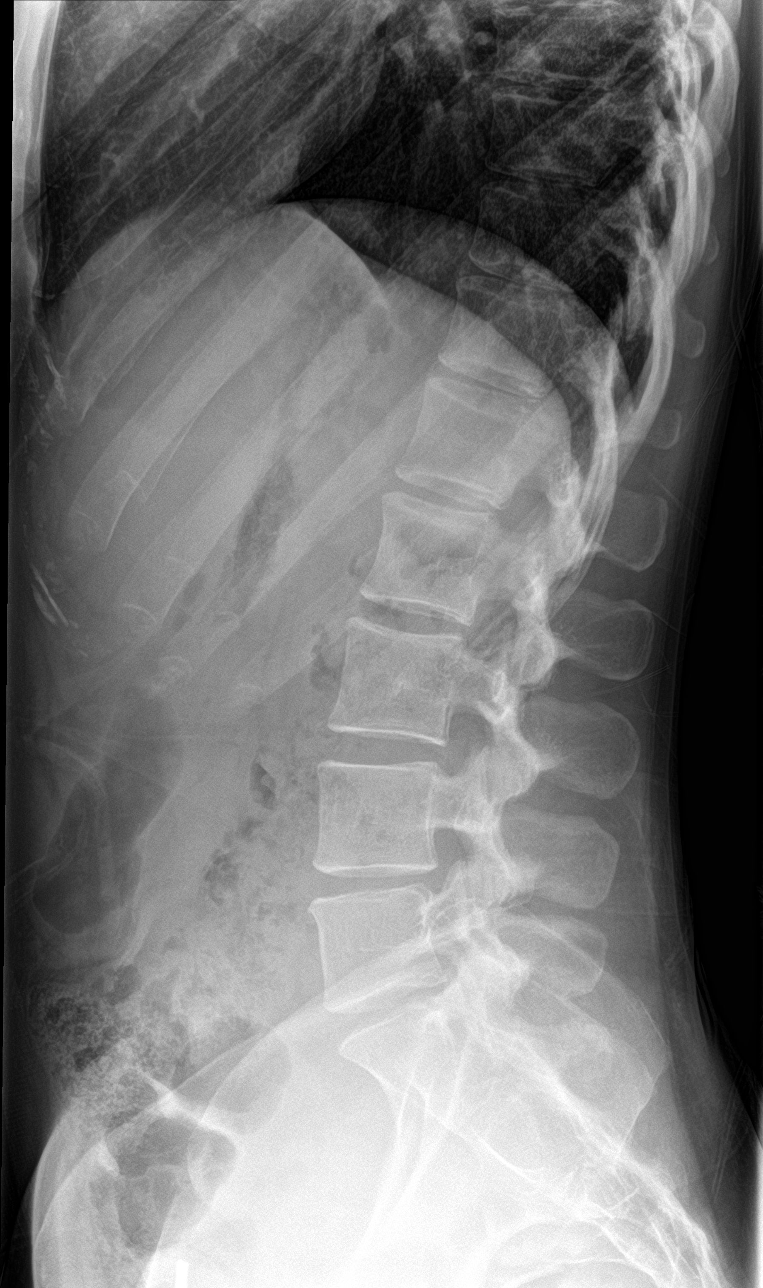

[l-spine spot]
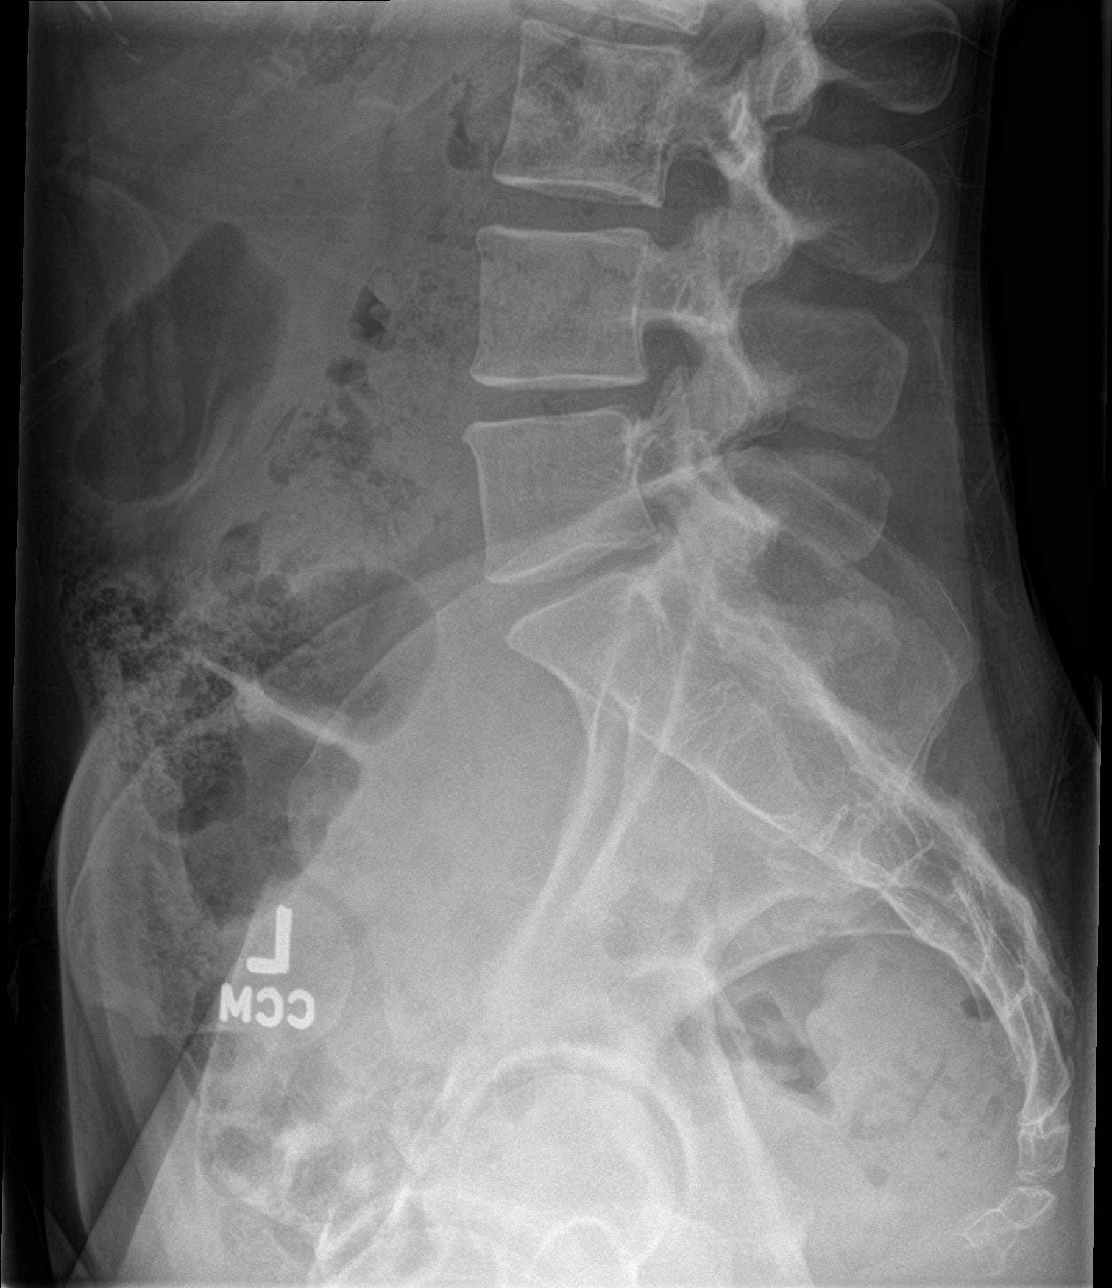

[5 of 5 positions shown; findings below may reference images not displayed]

FINDINGS: The alignment is maintained. Vertebral body heights are normal.
There is no listhesis. The posterior elements are intact. Minor disc
space narrowing at L4-L5 and L5-S1. No fracture. Sacroiliac joints
are symmetric and normal.
IMPRESSION: 1. No acute fracture or subluxation of the lumbar spine.
2. Minor disc space narrowing at L4-L5 and L5-S1.
